# Patient Record
Sex: Female | Born: 1935 | Race: White | Hispanic: No | Smoking: Never smoker
Health system: Southern US, Community
[De-identification: ages and names within clinical notes are randomized; demographics above are authoritative.]

## PROBLEM LIST (undated history)

## (undated) DIAGNOSIS — I10 Essential (primary) hypertension: Secondary | ICD-10-CM

## (undated) DIAGNOSIS — E785 Hyperlipidemia, unspecified: Secondary | ICD-10-CM

## (undated) DIAGNOSIS — I998 Other disorder of circulatory system: Secondary | ICD-10-CM

## (undated) DIAGNOSIS — E119 Type 2 diabetes mellitus without complications: Secondary | ICD-10-CM

## (undated) DIAGNOSIS — I739 Peripheral vascular disease, unspecified: Secondary | ICD-10-CM

## (undated) DIAGNOSIS — Z9289 Personal history of other medical treatment: Secondary | ICD-10-CM

## (undated) DIAGNOSIS — M1990 Unspecified osteoarthritis, unspecified site: Secondary | ICD-10-CM

## (undated) DIAGNOSIS — R609 Edema, unspecified: Secondary | ICD-10-CM

## (undated) DIAGNOSIS — Z9862 Peripheral vascular angioplasty status: Secondary | ICD-10-CM

## (undated) DIAGNOSIS — R011 Cardiac murmur, unspecified: Secondary | ICD-10-CM

## (undated) DIAGNOSIS — R7989 Other specified abnormal findings of blood chemistry: Secondary | ICD-10-CM

## (undated) DIAGNOSIS — R6889 Other general symptoms and signs: Secondary | ICD-10-CM

## (undated) DIAGNOSIS — I7779 Dissection of other artery: Secondary | ICD-10-CM

## (undated) HISTORY — DX: Cardiac murmur, unspecified: R01.1

## (undated) HISTORY — PX: JOINT REPLACEMENT: SHX530

## (undated) HISTORY — DX: Edema, unspecified: R60.9

---

## 1999-10-22 ENCOUNTER — Ambulatory Visit: Admission: RE | Admit: 1999-10-22 | Discharge: 1999-10-22 | Payer: Self-pay | Admitting: Specialist

## 1999-10-22 ENCOUNTER — Encounter: Payer: Self-pay | Admitting: Specialist

## 1999-11-04 ENCOUNTER — Encounter (INDEPENDENT_AMBULATORY_CARE_PROVIDER_SITE_OTHER): Payer: Self-pay | Admitting: Specialist

## 1999-11-04 ENCOUNTER — Ambulatory Visit (HOSPITAL_COMMUNITY): Admission: RE | Admit: 1999-11-04 | Discharge: 1999-11-04 | Payer: Self-pay | Admitting: Specialist

## 2000-12-19 ENCOUNTER — Ambulatory Visit (HOSPITAL_COMMUNITY): Admission: RE | Admit: 2000-12-19 | Discharge: 2000-12-19 | Payer: Self-pay | Admitting: Specialist

## 2007-11-05 DIAGNOSIS — I10 Essential (primary) hypertension: Secondary | ICD-10-CM

## 2007-11-05 HISTORY — DX: Essential (primary) hypertension: I10

## 2008-10-19 ENCOUNTER — Emergency Department (HOSPITAL_COMMUNITY): Admission: EM | Admit: 2008-10-19 | Discharge: 2008-10-19 | Payer: Self-pay | Admitting: Emergency Medicine

## 2010-02-11 ENCOUNTER — Emergency Department (HOSPITAL_COMMUNITY): Admission: EM | Admit: 2010-02-11 | Discharge: 2010-02-11 | Payer: Self-pay | Admitting: Emergency Medicine

## 2010-03-11 ENCOUNTER — Emergency Department (HOSPITAL_COMMUNITY): Admission: EM | Admit: 2010-03-11 | Discharge: 2010-03-11 | Payer: Self-pay | Admitting: Emergency Medicine

## 2010-04-03 ENCOUNTER — Ambulatory Visit: Payer: Self-pay | Admitting: Vascular Surgery

## 2010-09-27 ENCOUNTER — Encounter: Payer: Self-pay | Admitting: Cardiovascular Disease

## 2010-09-27 ENCOUNTER — Encounter: Payer: Self-pay | Admitting: Family Medicine

## 2010-12-22 LAB — URINALYSIS, ROUTINE W REFLEX MICROSCOPIC
Bilirubin Urine: NEGATIVE
Glucose, UA: 100 mg/dL — AB
Hgb urine dipstick: NEGATIVE
Specific Gravity, Urine: 1.012 (ref 1.005–1.030)
Urobilinogen, UA: 0.2 mg/dL (ref 0.0–1.0)
pH: 5.5 (ref 5.0–8.0)

## 2010-12-22 LAB — URINE MICROSCOPIC-ADD ON

## 2011-01-22 NOTE — Op Note (Signed)
[place]. [facility]  Patient:    Terri Alvarez, Terri Alvarez Visit Number: [id number] MRN: [id number]          Service Type: DSU Location: [facility] 2858 01 Attending Physician:  Mick Sell Proc. Date: 11/04/99 Admit Date:  11/04/1999 Discharge Date: 11/04/1999                             Operative Report  SURGEON:  Chucky May, M.D.  PREOPERATIVE DIAGNOSIS:  Cataract, OD.  POSTOPERATIVE DIAGNOSIS:  Advanced glaucoma, OD with poor control on topical ocular medications.  PROCEDURE:  Cataract extraction with intraocular lens implant, OS.  INDICATIONS:  The patient is a 77 year old female with painless, progressive decrease in vision so that she has difficulty seeing for reading and driving. On examination she was found to have a dense nuclear sclerotic and cortical cataract consistent with decreasing visual acuity.  Additionally she has longstanding glaucoma with difficulty in controlling the pressure partially because of compliance difficulty.  Additionally, she has marked cupping with visual field loss.  Because of the difficulty with compliance and controlling her medications, it was felt that cataract extraction combined with filtration procedure would give her the best possibility for visual rehabilitation.  DESCRIPTION OF PROCEDURE:  The patient was brought to the main operating room and placed in the supine position.  Anesthesia was obtained by means of topical 4% lidocaine drops with Tetracaine.  She was then prepped and draped in the usual fashion.  The lid speculum was inserted and a limbal peritomy was performed superiorly with light cautery to control the bleeding.  A triangular 4 x 4 x 4 m flap was then developed from approximately 4 mm superior to the limbus at 12 oclock and dissected partial thickness down into the clear cornea with the diamond-blade knife. The cornea was entered with the diamond keratome at the base of this flap with  an additional port was created superior temporally in clear cornea with a 15 degree blade.  Occucoat was instilled and an anterior capsulorrhexis was performed without difficulty.  The nucleus was mobilized by hydrodissection following by phacoemulsification of the nucleus and removal of residual cortical material by irrigation and aspiration.  The posterior capsule was polished and posterior chamber lens implant was placed on the body without difficulty.  Miochol was then instilled and attention was turned to the filtration portion of the procedure where a 1 x 1 x 4 mm block on the posterior limb sclera was excised by sharp dissection.  There was spontaneous prolapse of the iris and Miochol was instilled to constrict the pupil followed by basilar peripheral iridectomy at the base of the flap.  The corneoscleral flap was then closed with two interrupted 10-0 nylon sutures followed by closure of the conjunctival flap  over this flap in a watertight fashion using two wing sutures of 10-0 nylon. After closure of the procedure the anterior chamber was deep and clear.  The intraocular lens was well centered.  There was early bleb formation in the eye felt normotensive. The eye was dressed with topical Pred-Forte, Occuflox, Voltaren, nd a Fox shield.  The patient was taken to the recovery room in excellent condition where she received written and verbal instructions for his postoperative care and was scheduled for follow-up in 24 hours.  Additionally, she was given 10 mg of Decadron intravenously. Attending Physician:  Mick Sell DD:  11/04/99 TD:  11/04/99 Job: [id number]

## 2011-05-17 IMAGING — CR DG ABDOMEN ACUTE W/ 1V CHEST
3 series · 3 of 3 positions shown · non-contrast
Comparison: None.

CLINICAL DATA: Abdominal distention.  Constipation.

ACUTE ABDOMEN SERIES (ABDOMEN 2 VIEW & CHEST 1 VIEW)

[w chest pa]
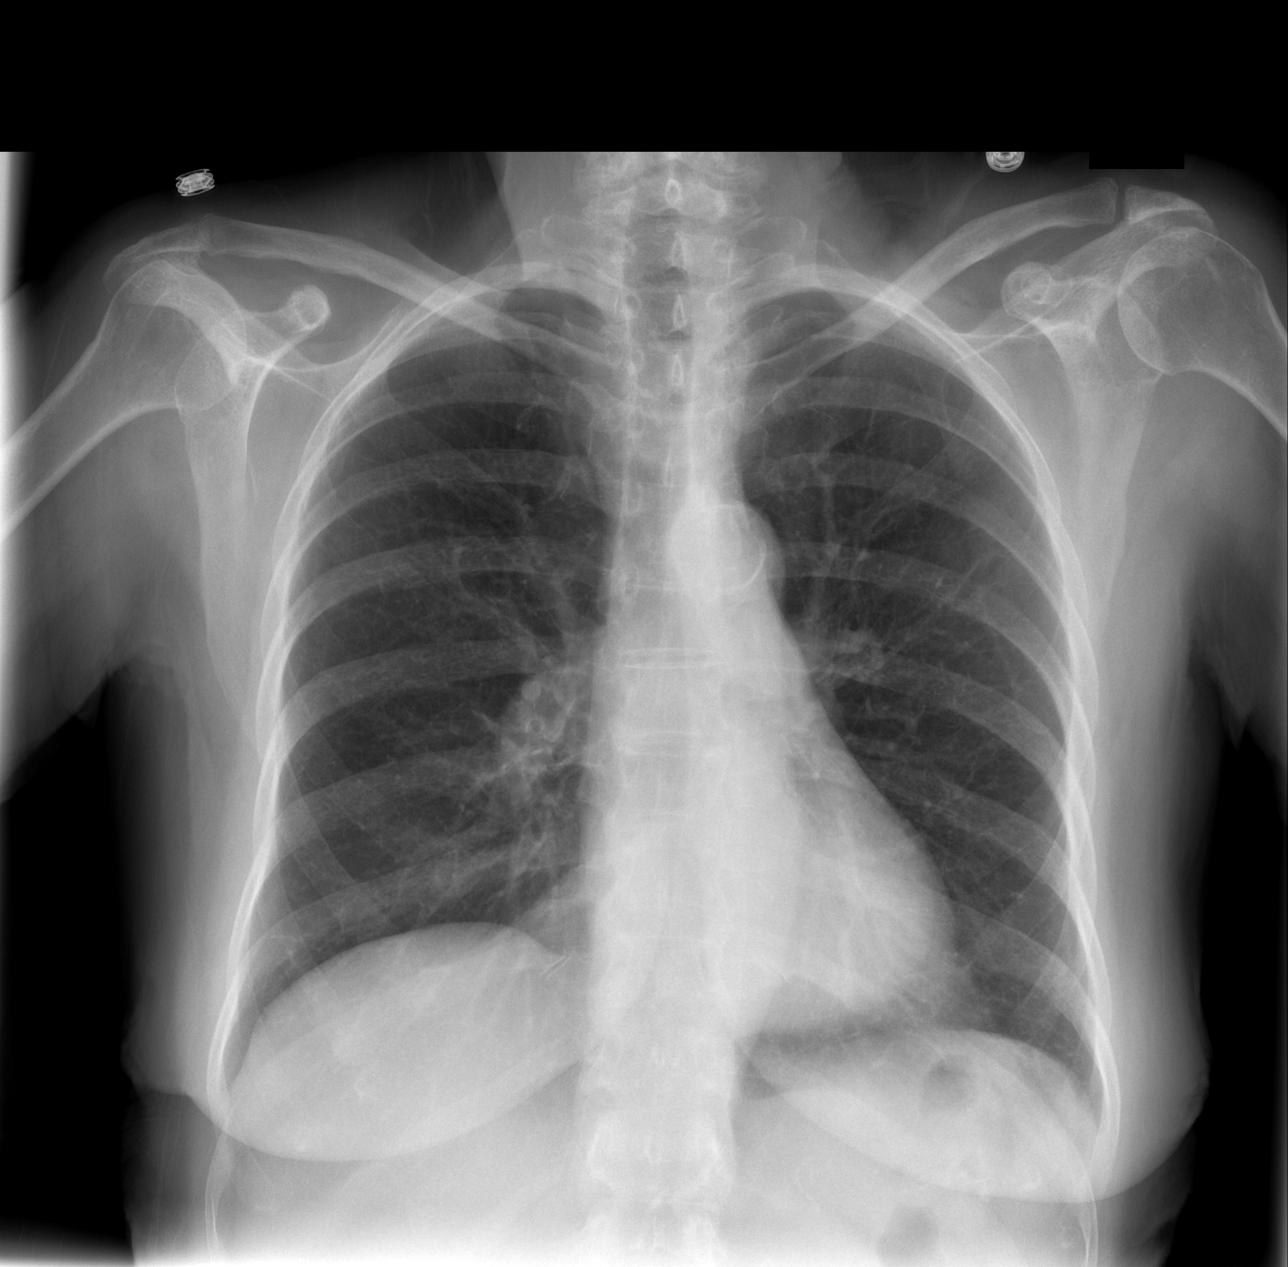

[w abdomen upright]
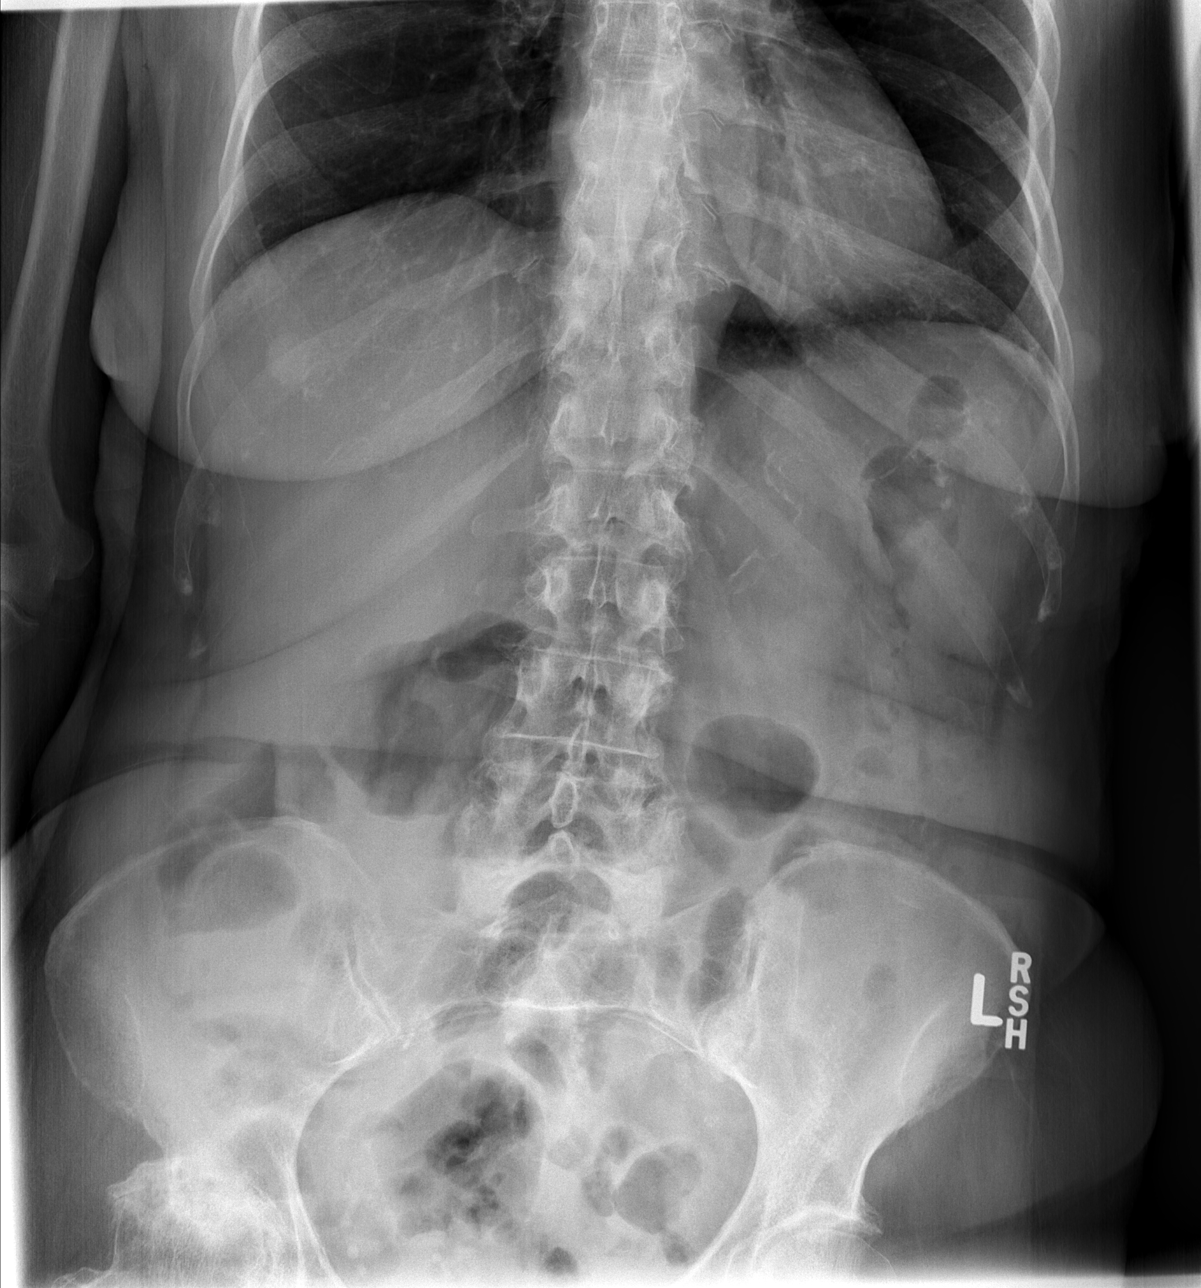

[t abdomen supine]
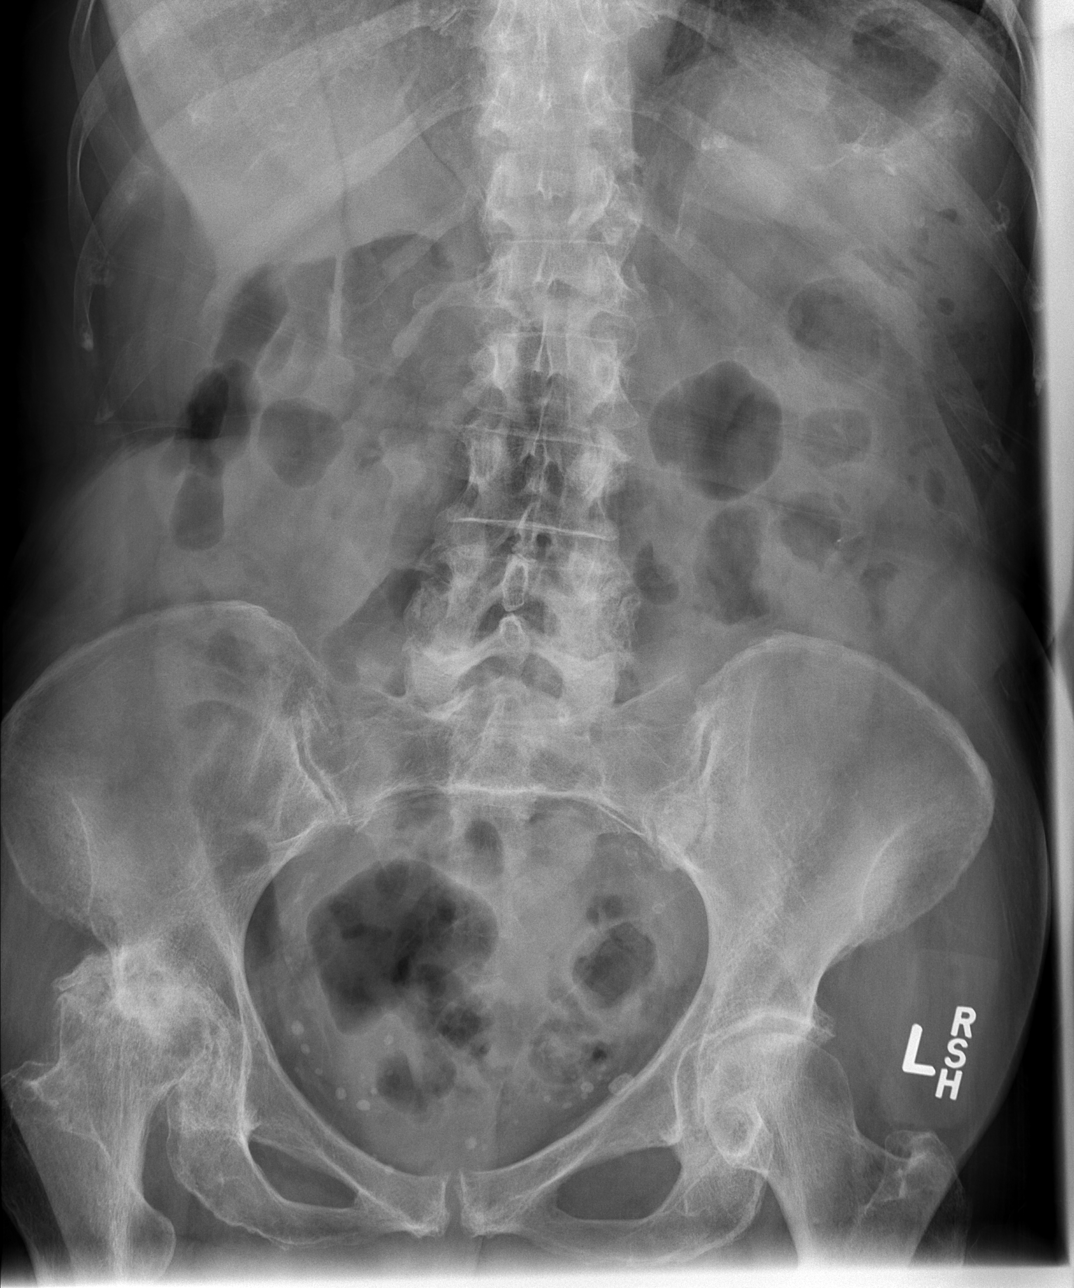

[3 of 3 positions shown; findings below may reference images not displayed]

FINDINGS: Heart size is normal and the vascularity is normal.  The
lungs are clear.

Normal bowel gas pattern.  Negative for bowel obstruction.  No
renal calculi.

Advanced degenerative change in the right hip joint with probable
AVN of the right femoral head.
IMPRESSION: No acute abnormality.

## 2011-09-07 HISTORY — PX: TOTAL HIP ARTHROPLASTY: SHX124

## 2011-12-06 DIAGNOSIS — Z9289 Personal history of other medical treatment: Secondary | ICD-10-CM

## 2011-12-06 HISTORY — DX: Personal history of other medical treatment: Z92.89

## 2011-12-15 ENCOUNTER — Other Ambulatory Visit: Payer: Self-pay | Admitting: Vascular Surgery

## 2011-12-27 ENCOUNTER — Other Ambulatory Visit: Payer: Self-pay | Admitting: Cardiovascular Disease

## 2011-12-29 ENCOUNTER — Ambulatory Visit
Admission: RE | Admit: 2011-12-29 | Discharge: 2011-12-29 | Disposition: A | Discharge status: Home / Self Care | Payer: Medicare Other | Source: Ambulatory Visit | Attending: Cardiovascular Disease | Admitting: Cardiovascular Disease

## 2011-12-29 ENCOUNTER — Other Ambulatory Visit: Payer: Self-pay | Admitting: Cardiovascular Disease

## 2011-12-29 DIAGNOSIS — Z01811 Encounter for preprocedural respiratory examination: Secondary | ICD-10-CM

## 2012-01-03 ENCOUNTER — Ambulatory Visit (HOSPITAL_COMMUNITY): Payer: Medicare Other | Admitting: Critical Care Medicine

## 2012-01-03 ENCOUNTER — Encounter (HOSPITAL_COMMUNITY)
Admission: RE | Disposition: A | Discharge status: Skilled Nursing Facility | Payer: Self-pay | Source: Ambulatory Visit | Attending: Vascular Surgery

## 2012-01-03 ENCOUNTER — Ambulatory Visit (HOSPITAL_COMMUNITY): Payer: Medicare Other

## 2012-01-03 ENCOUNTER — Inpatient Hospital Stay (HOSPITAL_COMMUNITY)
Admission: RE | Admit: 2012-01-03 | Discharge: 2012-01-07 | DRG: 252 | Disposition: A | Discharge status: Skilled Nursing Facility | Payer: Medicare Other | Source: Ambulatory Visit | Attending: Vascular Surgery | Admitting: Vascular Surgery

## 2012-01-03 ENCOUNTER — Encounter (HOSPITAL_COMMUNITY): Payer: Self-pay | Admitting: Critical Care Medicine

## 2012-01-03 ENCOUNTER — Encounter (HOSPITAL_COMMUNITY): Payer: Self-pay | Admitting: Infectious Diseases

## 2012-01-03 DIAGNOSIS — E785 Hyperlipidemia, unspecified: Secondary | ICD-10-CM | POA: Diagnosis present

## 2012-01-03 DIAGNOSIS — I10 Essential (primary) hypertension: Secondary | ICD-10-CM | POA: Diagnosis present

## 2012-01-03 DIAGNOSIS — Z9862 Peripheral vascular angioplasty status: Secondary | ICD-10-CM

## 2012-01-03 DIAGNOSIS — I70299 Other atherosclerosis of native arteries of extremities, unspecified extremity: Principal | ICD-10-CM | POA: Diagnosis present

## 2012-01-03 DIAGNOSIS — I998 Other disorder of circulatory system: Secondary | ICD-10-CM

## 2012-01-03 DIAGNOSIS — R6889 Other general symptoms and signs: Secondary | ICD-10-CM | POA: Diagnosis present

## 2012-01-03 DIAGNOSIS — Z7902 Long term (current) use of antithrombotics/antiplatelets: Secondary | ICD-10-CM

## 2012-01-03 DIAGNOSIS — I743 Embolism and thrombosis of arteries of the lower extremities: Secondary | ICD-10-CM

## 2012-01-03 DIAGNOSIS — I7779 Dissection of other artery: Secondary | ICD-10-CM | POA: Diagnosis not present

## 2012-01-03 DIAGNOSIS — I7092 Chronic total occlusion of artery of the extremities: Secondary | ICD-10-CM | POA: Diagnosis present

## 2012-01-03 DIAGNOSIS — R7989 Other specified abnormal findings of blood chemistry: Secondary | ICD-10-CM | POA: Diagnosis present

## 2012-01-03 DIAGNOSIS — E876 Hypokalemia: Secondary | ICD-10-CM | POA: Diagnosis present

## 2012-01-03 DIAGNOSIS — E119 Type 2 diabetes mellitus without complications: Secondary | ICD-10-CM | POA: Diagnosis present

## 2012-01-03 DIAGNOSIS — Z79899 Other long term (current) drug therapy: Secondary | ICD-10-CM

## 2012-01-03 HISTORY — DX: Peripheral vascular angioplasty status: Z98.62

## 2012-01-03 HISTORY — DX: Essential (primary) hypertension: I10

## 2012-01-03 HISTORY — DX: Type 2 diabetes mellitus without complications: E11.9

## 2012-01-03 HISTORY — DX: Other specified abnormal findings of blood chemistry: R79.89

## 2012-01-03 HISTORY — DX: Dissection of other specified artery: I77.79

## 2012-01-03 HISTORY — DX: Peripheral vascular disease, unspecified: I73.9

## 2012-01-03 HISTORY — DX: Other disorder of circulatory system: I99.8

## 2012-01-03 HISTORY — PX: ATHERECTOMY: SHX5502

## 2012-01-03 HISTORY — PX: VASCULAR SURGERY: SHX849

## 2012-01-03 HISTORY — PX: EMBOLECTOMY: SHX44

## 2012-01-03 HISTORY — PX: FEMORAL ARTERY - TIBIAL ARTERY BYPASS GRAFT: SUR181

## 2012-01-03 HISTORY — PX: LOWER EXTREMITY ANGIOGRAM: SHX5508

## 2012-01-03 HISTORY — DX: Other general symptoms and signs: R68.89

## 2012-01-03 HISTORY — DX: Hyperlipidemia, unspecified: E78.5

## 2012-01-03 LAB — GLUCOSE, CAPILLARY: Glucose-Capillary: 131 mg/dL — ABNORMAL HIGH (ref 70–99)

## 2012-01-03 SURGERY — EMBOLECTOMY
Anesthesia: General | Site: Leg Lower | Laterality: Right | Wound class: Clean

## 2012-01-03 SURGERY — ATHERECTOMY
Anesthesia: LOCAL

## 2012-01-03 MED ORDER — LIDOCAINE HCL (PF) 1 % IJ SOLN
INTRAMUSCULAR | Status: AC
  Filled 2012-01-03: qty 30

## 2012-01-03 MED ORDER — SODIUM CHLORIDE 0.9 % IV SOLN
INTRAVENOUS | Status: DC | PRN
  Administered 2012-01-03: 15:00:00 via INTRAVENOUS

## 2012-01-03 MED ORDER — POTASSIUM CHLORIDE CRYS ER 20 MEQ PO TBCR: 20.0000 meq | EXTENDED_RELEASE_TABLET | Freq: Every day | ORAL | Status: DC | PRN

## 2012-01-03 MED ORDER — DEXTROSE-NACL 5-0.45 % IV SOLN
INTRAVENOUS | Status: DC
  Administered 2012-01-03: 23:00:00 via INTRAVENOUS

## 2012-01-03 MED ORDER — PHENOL 1.4 % MT LIQD: 1.0000 | OROMUCOSAL | Status: DC | PRN

## 2012-01-03 MED ORDER — 0.9 % SODIUM CHLORIDE (POUR BTL) OPTIME
TOPICAL | Status: DC | PRN
  Administered 2012-01-03: 1000 mL

## 2012-01-03 MED ORDER — GUAIFENESIN-DM 100-10 MG/5ML PO SYRP: 15.0000 mL | ORAL_SOLUTION | ORAL | Status: DC | PRN

## 2012-01-03 MED ORDER — PHENYLEPHRINE HCL 10 MG/ML IJ SOLN
10.0000 mg | INTRAVENOUS | Status: DC | PRN
  Administered 2012-01-03: 19:00:00 via INTRAVENOUS
  Administered 2012-01-03: 10 ug/min via INTRAVENOUS

## 2012-01-03 MED ORDER — ONDANSETRON HCL 4 MG/2ML IJ SOLN
INTRAMUSCULAR | Status: DC | PRN
  Administered 2012-01-03 (×2): 4 mg via INTRAVENOUS

## 2012-01-03 MED ORDER — MIDAZOLAM HCL 2 MG/2ML IJ SOLN: 1.0000 mg | INTRAMUSCULAR | Status: DC | PRN

## 2012-01-03 MED ORDER — SODIUM CHLORIDE 0.9 % IV SOLN
INTRAVENOUS | Status: DC
  Administered 2012-01-03: 12:00:00 via INTRAVENOUS

## 2012-01-03 MED ORDER — HYDROMORPHONE HCL PF 1 MG/ML IJ SOLN
0.5000 mg | INTRAMUSCULAR | Status: DC | PRN
  Administered 2012-01-04 (×2): 0.5 mg via INTRAVENOUS
  Filled 2012-01-03: qty 1

## 2012-01-03 MED ORDER — ONDANSETRON HCL 4 MG/2ML IJ SOLN: 4.0000 mg | Freq: Four times a day (QID) | INTRAMUSCULAR | Status: DC | PRN

## 2012-01-03 MED ORDER — FUROSEMIDE 20 MG PO TABS
20.0000 mg | ORAL_TABLET | Freq: Every day | ORAL | Status: DC
  Administered 2012-01-04 – 2012-01-07 (×4): 20 mg via ORAL
  Filled 2012-01-03 (×4): qty 1

## 2012-01-03 MED ORDER — ACETAMINOPHEN 325 MG PO TABS: 650.0000 mg | ORAL_TABLET | ORAL | Status: DC | PRN

## 2012-01-03 MED ORDER — PROPOFOL 10 MG/ML IV EMUL
INTRAVENOUS | Status: DC | PRN
  Administered 2012-01-03: 50 mg via INTRAVENOUS

## 2012-01-03 MED ORDER — LORAZEPAM 2 MG/ML IJ SOLN: 1.0000 mg | Freq: Once | INTRAMUSCULAR | Status: DC | PRN

## 2012-01-03 MED ORDER — LISINOPRIL 20 MG PO TABS
20.0000 mg | ORAL_TABLET | Freq: Every day | ORAL | Status: DC
  Administered 2012-01-04 – 2012-01-07 (×4): 20 mg via ORAL
  Filled 2012-01-03 (×4): qty 1

## 2012-01-03 MED ORDER — HYDRALAZINE HCL 20 MG/ML IJ SOLN
10.0000 mg | INTRAMUSCULAR | Status: DC | PRN
  Filled 2012-01-03: qty 0.5

## 2012-01-03 MED ORDER — DIAZEPAM 5 MG PO TABS
5.0000 mg | ORAL_TABLET | ORAL | Status: AC
  Administered 2012-01-03: 5 mg via ORAL
  Filled 2012-01-03: qty 1

## 2012-01-03 MED ORDER — LABETALOL HCL 5 MG/ML IV SOLN
10.0000 mg | INTRAVENOUS | Status: DC | PRN
  Filled 2012-01-03: qty 4

## 2012-01-03 MED ORDER — SODIUM CHLORIDE 0.9 % IV SOLN: 500.0000 mL | Freq: Once | INTRAVENOUS | Status: AC | PRN

## 2012-01-03 MED ORDER — SODIUM CHLORIDE 0.9 % IJ SOLN: 3.0000 mL | INTRAMUSCULAR | Status: DC | PRN

## 2012-01-03 MED ORDER — DEXTROSE 5 % IV SOLN
1.5000 g | Freq: Two times a day (BID) | INTRAVENOUS | Status: DC
  Filled 2012-01-03: qty 1.5

## 2012-01-03 MED ORDER — ACETAMINOPHEN 650 MG RE SUPP: 325.0000 mg | RECTAL | Status: DC | PRN

## 2012-01-03 MED ORDER — MIDAZOLAM HCL 2 MG/2ML IJ SOLN
INTRAMUSCULAR | Status: AC
  Filled 2012-01-03: qty 2

## 2012-01-03 MED ORDER — TRAMADOL HCL 50 MG PO TABS
50.0000 mg | ORAL_TABLET | Freq: Four times a day (QID) | ORAL | Status: DC | PRN
  Administered 2012-01-03: 50 mg via ORAL
  Filled 2012-01-03: qty 1

## 2012-01-03 MED ORDER — LIDOCAINE HCL (CARDIAC) 20 MG/ML IV SOLN
INTRAVENOUS | Status: DC | PRN
  Administered 2012-01-03: 100 mg via INTRAVENOUS

## 2012-01-03 MED ORDER — EPHEDRINE SULFATE 50 MG/ML IJ SOLN
INTRAMUSCULAR | Status: DC | PRN
  Administered 2012-01-03: 5 mg via INTRAVENOUS

## 2012-01-03 MED ORDER — HEPARIN (PORCINE) IN NACL 2-0.9 UNIT/ML-% IJ SOLN
INTRAMUSCULAR | Status: AC
  Filled 2012-01-03: qty 1000

## 2012-01-03 MED ORDER — HEPARIN SODIUM (PORCINE) 1000 UNIT/ML IJ SOLN
INTRAMUSCULAR | Status: AC
  Filled 2012-01-03: qty 1

## 2012-01-03 MED ORDER — HYDROMORPHONE HCL PF 1 MG/ML IJ SOLN: 0.2500 mg | INTRAMUSCULAR | Status: DC | PRN

## 2012-01-03 MED ORDER — THROMBIN 20000 UNITS EX SOLR
CUTANEOUS | Status: DC | PRN
  Administered 2012-01-03: 20000 [IU] via TOPICAL

## 2012-01-03 MED ORDER — HETASTARCH-ELECTROLYTES 6 % IV SOLN
INTRAVENOUS | Status: DC | PRN
  Administered 2012-01-03: 18:00:00 via INTRAVENOUS

## 2012-01-03 MED ORDER — SODIUM CHLORIDE 0.9 % IR SOLN
Status: DC | PRN
  Administered 2012-01-03: 1000 mL

## 2012-01-03 MED ORDER — ROCURONIUM BROMIDE 100 MG/10ML IV SOLN
INTRAVENOUS | Status: DC | PRN
  Administered 2012-01-03: 40 mg via INTRAVENOUS
  Administered 2012-01-03: 10 mg via INTRAVENOUS

## 2012-01-03 MED ORDER — HEPARIN SODIUM (PORCINE) 1000 UNIT/ML IJ SOLN
INTRAMUSCULAR | Status: DC | PRN
  Administered 2012-01-03: 4 mL via INTRAVENOUS
  Administered 2012-01-03: 2 mL via INTRAVENOUS

## 2012-01-03 MED ORDER — LACTATED RINGERS IV SOLN
INTRAVENOUS | Status: DC | PRN
  Administered 2012-01-03 (×3): via INTRAVENOUS

## 2012-01-03 MED ORDER — NEOSTIGMINE METHYLSULFATE 1 MG/ML IJ SOLN
INTRAMUSCULAR | Status: DC | PRN
  Administered 2012-01-03: 3 mg via INTRAVENOUS

## 2012-01-03 MED ORDER — SODIUM CHLORIDE 0.9 % IV SOLN: INTRAVENOUS | Status: AC

## 2012-01-03 MED ORDER — ACETAMINOPHEN 325 MG PO TABS: 325.0000 mg | ORAL_TABLET | ORAL | Status: DC | PRN

## 2012-01-03 MED ORDER — IOHEXOL 300 MG/ML  SOLN
INTRAMUSCULAR | Status: DC | PRN
  Administered 2012-01-03: 50 mL via INTRA_ARTERIAL

## 2012-01-03 MED ORDER — ONDANSETRON HCL 4 MG/2ML IJ SOLN: 4.0000 mg | Freq: Once | INTRAMUSCULAR | Status: DC | PRN

## 2012-01-03 MED ORDER — BISACODYL 10 MG RE SUPP
10.0000 mg | Freq: Every day | RECTAL | Status: DC | PRN
  Filled 2012-01-03: qty 1

## 2012-01-03 MED ORDER — FENTANYL CITRATE 0.05 MG/ML IJ SOLN
INTRAMUSCULAR | Status: AC
  Filled 2012-01-03: qty 2

## 2012-01-03 MED ORDER — VECURONIUM BROMIDE 10 MG IV SOLR
INTRAVENOUS | Status: DC | PRN
  Administered 2012-01-03 (×2): 2 mg via INTRAVENOUS

## 2012-01-03 MED ORDER — PROTAMINE SULFATE 10 MG/ML IV SOLN
INTRAVENOUS | Status: DC | PRN
  Administered 2012-01-03 (×5): 10 mg via INTRAVENOUS

## 2012-01-03 MED ORDER — DOCUSATE SODIUM 100 MG PO CAPS
100.0000 mg | ORAL_CAPSULE | Freq: Every day | ORAL | Status: DC
  Administered 2012-01-04 – 2012-01-07 (×4): 100 mg via ORAL
  Filled 2012-01-03 (×4): qty 1

## 2012-01-03 MED ORDER — DEXTROSE 5 % IV SOLN
INTRAVENOUS | Status: AC
  Filled 2012-01-03: qty 50

## 2012-01-03 MED ORDER — CEPHALEXIN 500 MG PO CAPS
500.0000 mg | ORAL_CAPSULE | Freq: Two times a day (BID) | ORAL | Status: DC
  Administered 2012-01-05 – 2012-01-07 (×5): 500 mg via ORAL
  Filled 2012-01-03 (×6): qty 1

## 2012-01-03 MED ORDER — PHENYLEPHRINE HCL 10 MG/ML IJ SOLN
INTRAMUSCULAR | Status: DC | PRN
  Administered 2012-01-03 (×2): 80 ug via INTRAVENOUS
  Administered 2012-01-03 (×2): 40 ug via INTRAVENOUS

## 2012-01-03 MED ORDER — OXYCODONE-ACETAMINOPHEN 5-325 MG PO TABS
1.0000 | ORAL_TABLET | ORAL | Status: DC | PRN
  Administered 2012-01-04 (×2): 1 via ORAL
  Administered 2012-01-04: 2 via ORAL
  Administered 2012-01-04 (×2): 1 via ORAL
  Administered 2012-01-05: 2 via ORAL
  Administered 2012-01-05 (×2): 1 via ORAL
  Administered 2012-01-06 (×2): 2 via ORAL
  Administered 2012-01-07: 1 via ORAL
  Administered 2012-01-07: 2 via ORAL
  Filled 2012-01-03 (×2): qty 1
  Filled 2012-01-03: qty 2
  Filled 2012-01-03 (×4): qty 1
  Filled 2012-01-03 (×3): qty 2
  Filled 2012-01-03: qty 1
  Filled 2012-01-03: qty 2

## 2012-01-03 MED ORDER — DEXTROSE 5 % IV SOLN
1.5000 g | INTRAVENOUS | Status: DC | PRN
  Administered 2012-01-03: 1.5 g via INTRAVENOUS

## 2012-01-03 MED ORDER — GLYCOPYRROLATE 0.2 MG/ML IJ SOLN
INTRAMUSCULAR | Status: DC | PRN
  Administered 2012-01-03: 0.4 mg via INTRAVENOUS

## 2012-01-03 MED ORDER — ASPIRIN EC 81 MG PO TBEC
81.0000 mg | DELAYED_RELEASE_TABLET | Freq: Every day | ORAL | Status: DC
  Filled 2012-01-03: qty 1

## 2012-01-03 MED ORDER — CEFUROXIME SODIUM 1.5 G IJ SOLR
INTRAMUSCULAR | Status: AC
  Filled 2012-01-03: qty 1.5

## 2012-01-03 MED ORDER — FENTANYL CITRATE 0.05 MG/ML IJ SOLN: 50.0000 ug | INTRAMUSCULAR | Status: DC | PRN

## 2012-01-03 MED ORDER — POLYETHYLENE GLYCOL 3350 17 G PO PACK
17.0000 g | PACK | Freq: Every day | ORAL | Status: DC | PRN
  Administered 2012-01-07: 17 g via ORAL
  Filled 2012-01-03: qty 1

## 2012-01-03 MED ORDER — SODIUM CHLORIDE 0.9 % IR SOLN
Status: DC | PRN
  Administered 2012-01-03: 17:00:00

## 2012-01-03 MED ORDER — METOPROLOL TARTRATE 1 MG/ML IV SOLN: 2.0000 mg | INTRAVENOUS | Status: DC | PRN

## 2012-01-03 MED ORDER — CARVEDILOL 6.25 MG PO TABS
6.2500 mg | ORAL_TABLET | Freq: Every day | ORAL | Status: DC
  Administered 2012-01-04: 6.25 mg via ORAL
  Filled 2012-01-03 (×2): qty 1

## 2012-01-03 MED ORDER — FENTANYL CITRATE 0.05 MG/ML IJ SOLN
INTRAMUSCULAR | Status: DC | PRN
  Administered 2012-01-03 (×3): 50 ug via INTRAVENOUS
  Administered 2012-01-03 (×2): 25 ug via INTRAVENOUS
  Administered 2012-01-03: 50 ug via INTRAVENOUS

## 2012-01-03 MED ORDER — SODIUM CHLORIDE 0.9 % IV SOLN: 1.5000 g | INTRAVENOUS | Status: DC | PRN

## 2012-01-03 MED ORDER — DOPAMINE-DEXTROSE 3.2-5 MG/ML-% IV SOLN: 3.0000 ug/kg/min | INTRAVENOUS | Status: DC

## 2012-01-03 SURGICAL SUPPLY — 80 items
ADH SKN CLS LQ APL DERMABOND (GAUZE/BANDAGES/DRESSINGS) ×2
BANDAGE ELASTIC 4 VELCRO ST LF (GAUZE/BANDAGES/DRESSINGS) IMPLANT
BANDAGE ESMARK 6X9 LF (GAUZE/BANDAGES/DRESSINGS) IMPLANT
BNDG CMPR 9X6 STRL LF SNTH (GAUZE/BANDAGES/DRESSINGS)
BNDG ESMARK 6X9 LF (GAUZE/BANDAGES/DRESSINGS)
CANISTER SUCTION 2500CC (MISCELLANEOUS) ×3 IMPLANT
CATH EMB 3FR 40CM (CATHETERS) ×6 IMPLANT
CATH EMB 3FR 80CM (CATHETERS) ×4 IMPLANT
CATH EMB 4FR 80CM (CATHETERS) ×2 IMPLANT
CLIP TI MEDIUM 24 (CLIP) ×3 IMPLANT
CLIP TI WIDE RED SMALL 24 (CLIP) ×3 IMPLANT
CLOTH BEACON ORANGE TIMEOUT ST (SAFETY) ×3 IMPLANT
COVER PROBE W GEL 5X96 (DRAPES) ×5 IMPLANT
COVER SURGICAL LIGHT HANDLE (MISCELLANEOUS) ×6 IMPLANT
CUFF TOURNIQUET SINGLE 18IN (TOURNIQUET CUFF) IMPLANT
CUFF TOURNIQUET SINGLE 24IN (TOURNIQUET CUFF) IMPLANT
CUFF TOURNIQUET SINGLE 34IN LL (TOURNIQUET CUFF) IMPLANT
CUFF TOURNIQUET SINGLE 44IN (TOURNIQUET CUFF) IMPLANT
DERMABOND ADHESIVE PROPEN (GAUZE/BANDAGES/DRESSINGS) ×1
DERMABOND ADVANCED .7 DNX6 (GAUZE/BANDAGES/DRESSINGS) ×1 IMPLANT
DRAIN CHANNEL 15F RND FF W/TCR (WOUND CARE) IMPLANT
DRAPE C-ARM 42X72 X-RAY (DRAPES) ×3 IMPLANT
DRAPE U-SHAPE 47X51 STRL (DRAPES) ×2 IMPLANT
DRAPE WARM FLUID 44X44 (DRAPE) ×3 IMPLANT
DRSG COVADERM 4X8 (GAUZE/BANDAGES/DRESSINGS) IMPLANT
DRSG TEGADERM 4X4.75 (GAUZE/BANDAGES/DRESSINGS) ×2 IMPLANT
ELECT REM PT RETURN 9FT ADLT (ELECTROSURGICAL) ×3
ELECTRODE REM PT RTRN 9FT ADLT (ELECTROSURGICAL) ×2 IMPLANT
EVACUATOR SILICONE 100CC (DRAIN) IMPLANT
GAUZE SPONGE 4X4 16PLY XRAY LF (GAUZE/BANDAGES/DRESSINGS) ×2 IMPLANT
GLOVE BIO SURGEON STRL SZ 6.5 (GLOVE) ×6 IMPLANT
GLOVE BIO SURGEON STRL SZ7 (GLOVE) ×7 IMPLANT
GLOVE BIOGEL PI IND STRL 6.5 (GLOVE) ×4 IMPLANT
GLOVE BIOGEL PI IND STRL 7.0 (GLOVE) ×4 IMPLANT
GLOVE BIOGEL PI IND STRL 7.5 (GLOVE) ×3 IMPLANT
GLOVE BIOGEL PI INDICATOR 6.5 (GLOVE) ×4
GLOVE BIOGEL PI INDICATOR 7.0 (GLOVE) ×4
GLOVE BIOGEL PI INDICATOR 7.5 (GLOVE) ×2
GLOVE ECLIPSE 6.5 STRL STRAW (GLOVE) ×2 IMPLANT
GOWN PREVENTION PLUS XXLARGE (GOWN DISPOSABLE) ×2 IMPLANT
GOWN STRL NON-REIN LRG LVL3 (GOWN DISPOSABLE) ×13 IMPLANT
GOWN STRL REIN XL XLG (GOWN DISPOSABLE) ×4 IMPLANT
KIT BASIN OR (CUSTOM PROCEDURE TRAY) ×3 IMPLANT
KIT ROOM TURNOVER OR (KITS) ×3 IMPLANT
MARKER GRAFT CORONARY BYPASS (MISCELLANEOUS) IMPLANT
NS IRRIG 1000ML POUR BTL (IV SOLUTION) ×6 IMPLANT
PACK PERIPHERAL VASCULAR (CUSTOM PROCEDURE TRAY) ×3 IMPLANT
PAD ARMBOARD 7.5X6 YLW CONV (MISCELLANEOUS) ×6 IMPLANT
PADDING CAST COTTON 6X4 STRL (CAST SUPPLIES) IMPLANT
PATCH VASCULAR VASCU GUARD 1X6 (Vascular Products) ×2 IMPLANT
SET COLLECT BLD 21X3/4 12 PB (MISCELLANEOUS) ×2 IMPLANT
SHEATH AVANTI 11CM 5FR (MISCELLANEOUS) ×2 IMPLANT
SPONGE GAUZE 4X4 12PLY (GAUZE/BANDAGES/DRESSINGS) ×2 IMPLANT
SPONGE SURGIFOAM ABS GEL 100 (HEMOSTASIS) ×2 IMPLANT
STAPLER VISISTAT 35W (STAPLE) IMPLANT
STOPCOCK 4 WAY LG BORE MALE ST (IV SETS) ×5 IMPLANT
SUT ETHILON 4 0 P 3 18 (SUTURE) ×4 IMPLANT
SUT MNCRL AB 4-0 PS2 18 (SUTURE) ×6 IMPLANT
SUT PROLENE 5 0 C 1 24 (SUTURE) ×3 IMPLANT
SUT PROLENE 6 0 BV (SUTURE) ×7 IMPLANT
SUT PROLENE 7 0 BV 1 (SUTURE) ×2 IMPLANT
SUT PROLENE 7 0 BV1 MDA (SUTURE) ×2 IMPLANT
SUT SILK 2 0 FS (SUTURE) IMPLANT
SUT SILK 2 0 SH (SUTURE) ×3 IMPLANT
SUT SILK 3 0 (SUTURE)
SUT SILK 3-0 18XBRD TIE 12 (SUTURE) IMPLANT
SUT VIC AB 2-0 CT1 27 (SUTURE) ×6
SUT VIC AB 2-0 CT1 TAPERPNT 27 (SUTURE) ×4 IMPLANT
SUT VIC AB 3-0 SH 27 (SUTURE) ×6
SUT VIC AB 3-0 SH 27X BRD (SUTURE) ×4 IMPLANT
SYR 3ML LL SCALE MARK (SYRINGE) ×2 IMPLANT
SYR TB 1ML LUER SLIP (SYRINGE) ×2 IMPLANT
TAPE CLOTH SURG 4X10 WHT LF (GAUZE/BANDAGES/DRESSINGS) ×2 IMPLANT
TOWEL OR 17X24 6PK STRL BLUE (TOWEL DISPOSABLE) ×6 IMPLANT
TOWEL OR 17X26 10 PK STRL BLUE (TOWEL DISPOSABLE) ×6 IMPLANT
TRAY FOLEY CATH 14FRSI W/METER (CATHETERS) ×3 IMPLANT
TUBING EXTENTION W/L.L. (IV SETS) ×3 IMPLANT
UNDERPAD 30X30 INCONTINENT (UNDERPADS AND DIAPERS) ×3 IMPLANT
WATER STERILE IRR 1000ML POUR (IV SOLUTION) ×3 IMPLANT
WIRE MICRO SET SILHO 5FR 7 (SHEATH) ×3 IMPLANT

## 2012-01-03 NOTE — Transfer of Care (Signed)
Immediate Anesthesia Transfer of Care Note  Patient: Terri Alvarez  Procedure(s) Performed: Procedure(s) (LRB): BYPASS GRAFT FEMORAL-TIBIAL ARTERY (Right)  Patient Location: PACU  Anesthesia Type: General  Level of Consciousness: awake, alert  and oriented  Airway & Oxygen Therapy: Patient Spontanous Breathing and Patient connected to nasal cannula oxygen  Post-op Assessment: Report given to PACU RN, Post -op Vital signs reviewed and stable and Patient moving all extremities X 4  Post vital signs: Reviewed and stable  Complications: No apparent anesthesia complications

## 2012-01-03 NOTE — H&P (Signed)
H & P will be scanned in.  Pt was reexamined and existing H & P reviewed. No changes found.  Runell Gess, MD [facility] 01/03/2012 1:14 PM

## 2012-01-03 NOTE — Op Note (Signed)
Terri Alvarez is a 78 y.o. female    [id number] LOCATION:  FACILITY: [facility]  PHYSICIAN: Nanetta Batty, M.D. November 03, 1927   DATE OF PROCEDURE:  01/03/2012  DATE OF DISCHARGE:  [facility]  PV Angiogram/Intervention    History obtained from chart review. Ms. Clauson is an 78 year old frail-appearing female with critical limb ischemia and lotion he Dopplers which showed high-grade disease in the mid right SFA. She has a negative Myoview. She presents now for angiography and potential Percocet intervention. Positive hypertension, hypokalemia and diabetes.   PROCEDURE DESCRIPTION:    The patient was brought to the second floor  [facility] lab in the postabsorptive state. She was premedicated with Valium by mouth and IV Versed and fentanyl. Her left groin was prepped and shaved in usual sterile fashion. Xylocaine 1% was used  for local anesthesia. A 5 French sheath was inserted into the left common femoral  artery using standard Seldinger technique. A 5 [place] tennis racket catheter was used for abdominal aortography bifemoral runoff using bolus chase digital subtraction step table technique. Visipaque dye was just for the entirety of the case. Retrograde aortic pressure is monitored in the case.  HEMODYNAMICS:    AO SYSTOLIC/AO DIASTOLIC: 178/51    ANGIOGRAPHIC RESULTS:   1: Abdominal aortogram-renal artery is widely patent. The infrarenal abdominal aorta was free of significant disease.  2: Left lower extremity-mild diffuse disease of the left SFA with one vessel runoff via the anterior tibial  3: Right lower extremity-95% focal calcified proximal right SFA, 70% segmental distal right SFA in the adductor canal, 90% above-the-knee focal popliteal stenosis two-vessel runoff via the anterior tibial and peroneal. The posterior tibial artery was occluded. The tibial vessels weren't diffusely diseased.   IMPRESSION:Ms. Sandall has critical limb ischemia  and fairly focal calcified lesions in her proximal mid to distal right SFA with two-vessel runoff. Her vessels are 4-5 mm in diameter making stenting problematic. The strategy will be to perform diamondback orbital rotational arthrectomy to debulk and low-pressure balloo ninflations subsequently in order to improve inflow and promote healing of her foot.  Procedure description: Allow access was obtained with a SOS catheter, angled Glidewire and 0.35 cm Rosen wire. A 7 French crossover sheath was advanced into the right external iliac artery over the bifurcation. The SFA was traversed with a 0.14 mm Rigali wire through a quick cross endhole catheter. Following the Toradol you wire was withdrawn and an 014 mm Viper wire was then exchanged and placed in the below-the-knee popliteal artery. Orbital atherectomy was performed with a 2 mm spelled per up 120,000 RPMs in the proximal SFA distal SFA and above in the popliteal artery.the patient received 4500 units of heparin intravenously with an ACT of 243. A total of 210 cc of contrast semester to the patient. Angiography revealed dissection with what appeared to be intraluminal filling defect. There was compromise of tibial flow as well. A prolonged inflation was performed with a 4 mm x 100 overlong balloon in the distal SFA resulting in improved flow but still poor distal flow and obvious dissection. After discussion with Dr. Salome Spotted, vascular surgeon, it was decided to abort the case and to take the patient for femoropopliteal bypass grafting and potential Fogarty thrombectomy.  Final impression: Dynabac orbital or tissue laparotomy, PTA of calcified right SFA for "critical limb ischemia". Unfortunately, I believe her vessels were very fragile and the atherectomy caused dissection of the distal right SFA with distal embolization compromising flow. She  did have a negative Myoview stress test. She left the peripheral vascular lab in stable condition on the way to  the operating room. I discussed the results of the case and the plan with her daughter.  Runell Gess MD, [facility] 01/03/2012 3:08 PM

## 2012-01-03 NOTE — Anesthesia Postprocedure Evaluation (Signed)
Anesthesia Post Note  Patient: Terri Alvarez  Procedure(s) Performed: Procedure(s) (LRB): BYPASS GRAFT FEMORAL-TIBIAL ARTERY (Right)  Anesthesia type: general  Patient location: PACU  Post pain: Pain level controlled  Post assessment: Patient's Cardiovascular Status Stable  Last Vitals:  Filed Vitals:   01/03/12 2130  BP:   Pulse: 72  Temp:   Resp: 14    Post vital signs: Reviewed and stable  Level of consciousness: sedated  Complications: No apparent anesthesia complications

## 2012-01-03 NOTE — Preoperative (Signed)
Beta Blockers   Reason not to administer Beta Blockers:Not Applicable 

## 2012-01-03 NOTE — Anesthesia Preprocedure Evaluation (Addendum)
Anesthesia Evaluation  Patient identified by MRN, date of birth, ID band Patient awake    Airway Mallampati: III  Neck ROM: Full  Mouth opening: Limited Mouth Opening  Dental  (+) Teeth Intact and Dental Advisory Given   Pulmonary  breath sounds clear to auscultation  Pulmonary exam normal       Cardiovascular hypertension, Pt. on home beta blockers + Peripheral Vascular Disease Rhythm:Regular Rate:Normal     Neuro/Psych    GI/Hepatic   Endo/Other  Diabetes mellitus-, Oral Hypoglycemic Agents  Renal/GU      Musculoskeletal   Abdominal   Peds  Hematology   Anesthesia Other Findings   Reproductive/Obstetrics                         Anesthesia Physical Anesthesia Plan  ASA: III and Emergent  Anesthesia Plan: General   Post-op Pain Management:    Induction: Intravenous  Airway Management Planned: Oral ETT  Additional Equipment:   Intra-op Plan:   Post-operative Plan: Extubation in OR  Informed Consent: I have reviewed the patients History and Physical, chart, labs and discussed the procedure including the risks, benefits and alternatives for the proposed anesthesia with the patient or authorized representative who has indicated his/her understanding and acceptance.   Dental advisory given  Plan Discussed with: CRNA and Surgeon  Anesthesia Plan Comments: (Acute occlusion of R. Femoral artery following attempted angioplasty today Htn Type 2 DM  Plan GA  Kipp Brood, MD)     Anesthesia Quick Evaluation

## 2012-01-03 NOTE — Anesthesia Procedure Notes (Signed)
Procedure Name: Intubation Date/Time: 01/03/2012 4:15 PM Performed by: Elon Alas Pre-anesthesia Checklist: Patient identified, Timeout performed, Emergency Drugs available, Suction available and Patient being monitored Patient Re-evaluated:Patient Re-evaluated prior to inductionOxygen Delivery Method: Circle system utilized Preoxygenation: Pre-oxygenation with 100% oxygen Intubation Type: IV induction Ventilation: Mask ventilation without difficulty Laryngoscope Size: Mac and 3 Grade View: Grade II Tube type: Oral Tube size: 7.5 mm Number of attempts: 1 Airway Equipment and Method: Stylet Placement Confirmation: positive ETCO2,  ETT inserted through vocal cords under direct vision and breath sounds checked- equal and bilateral Secured at: 21 cm Tube secured with: Tape Dental Injury: Teeth and Oropharynx as per pre-operative assessment

## 2012-01-03 NOTE — H&P (Signed)
  [facility] [place]  Brief History and Physical  History of Present Illness  Terri Alvarez is a 78 y.o. female who presents with chief complaint: acute right leg ischemia.  The patient underwent R SFA and popliteal orbital atherectomy and angioplasty and developed likely thrombosis of her popliteal artery and tibial arteries based on review of her images.   PMH, PSH, SH, FmH, ROS are on Dr. Hazle Coca scanned H&P  No current facility-administered medications on file prior to encounter.   Current Outpatient Prescriptions on File Prior to Encounter  Medication Sig Dispense Refill  . Calcium Carbonate-Vitamin D (CALCIUM + D PO) Take 1 tablet by mouth daily.      . carvedilol (COREG) 6.25 MG tablet Take 6.25 mg by mouth daily.      . furosemide (LASIX) 20 MG tablet Take 20 mg by mouth daily.        No Known Allergies  Physical Examination  Filed Vitals:   01/03/12 1158 01/03/12 1317  Pulse:  91  Height: 5' (1.524 m)   Weight: 112 lb (50.803 kg)    Body mass index is 21.87 kg/(m^2).  General: A&O x 3, WDWN, cachectic  Pulmonary: Sym exp, good air movt, CTAB, no rales, rhonchi, & wheezing  Cardiac: RRR, Nl S1, S2, no Murmurs, rubs or gallops  Gastrointestinal: soft, NTND, -G/R, - HSM, - masses, - CVAT B  Musculoskeletal: M/S 5/5 throughout , Extremities without ischemic changes except Right foot which appears ischemic  Laboratory See iStat  Medical Decision Making  Terri Alvarez is a 78 y.o. female who presents with: acute right leg ischemia related to percutaneous intervention.   The patient will be going emergently to the operating room for a right leg thrombectomy, possible femoral to popliteal vs tibial bypass, and possible fasciotomies The risk, benefits, and alternative for bypass operations were discussed with the patient.  The patient is aware the risks include but are not limited to: bleeding, infection, myocardial infarction, stroke, limb loss,  nerve damage, need for additional procedures in the future, wound complications, and inability to complete the bypass.  The patient is aware of these risks and agreed to proceed.  The patient is aware of the risks and agrees to proceed.  Leonides Sake, MD [facility] [place] Office: [phone] Pager: [phone]  01/03/2012, 4:05 PM

## 2012-01-03 NOTE — Op Note (Addendum)
OPERATIVE NOTE   PROCEDURE: 1. Right popliteal, anterior tibial, and peroneal artery thrombectomy 2. Patch angioplasty of right below-the-knee popliteal artery 3. Right common femoral artery cannulation with ultrasound guidance  4. Right leg angiogram 5. Distal anterior tibial exposure and thrombectomy  PRE-OPERATIVE DIAGNOSIS: Acute right leg ischemia due to tibial embolization from peripheral intervention  POST-OPERATIVE DIAGNOSIS: same as above  SURGEON: Leonides Sake, MD  ASSISTANT(S): Della Goo, [facility]  ANESTHESIA: general  ESTIMATED BLOOD LOSS: 500 cc  FINDING(S): 1. Blood flow down to level of ankle via anterior tibial artery with collaterals to foot 2. Good blood flow in ankle level anterior tibial artery and good backbleeding from foot 3. Peroneal artery terminates proximal to ankle level  SPECIMEN(S):  intrapopliteal artery debris  INDICATIONS:   Terri Alvarez is a 78 y.o. female who presented earlier in the cath lab with critical limb ischemia in right leg.  Dr. Allyson Sabal performed an orbital atherectomy and angioplasty of right superficial femoral artery and popliteal artery.  Intra-procedure, loss of tibial blood flow was identified.  Possible embolism to the right tibial arteries was diagnosed.  As the patient had acute limb ischemia from the embolism, I felt emergent intervention was necessary.  We discussed performing a right leg thrombectomy, possible femoral to popliteal vs tibial bypass, and possible other procedures as indicated.  The risk, benefits, and alternatives were discussed with the patient.  The patient is aware the risks include but are not limited to: bleeding, infection, myocardial infarction, stroke, limb loss, nerve damage, need for additional procedures in the future, wound complications, and inability to complete the bypass.  The patient is aware of these risks and agreed to proceed.  DESCRIPTION: After obtaining full informed written consent, the  patient was brought back to the operating room and placed supine upon the operating table.  The patient received IV antibiotics prior to induction.  After obtaining adequate anesthesia, the patient was prepped and draped in the standard fashion for: right leg bypass.  I turned my attention to the calf.  I made a longitudinal incision one finger-width posterior to the tibia and dissected deep with electrocautery and blunt dissection.  I opened the deep posterior compartment fascia and retracted the medial gastrocnemius head posteriorly, gaining access to the popliteal space.  I dissected out the medial popliteal vein and retracted it out of the way.  I then dissected the popliteal artery off the lateral popliteal vein.  I dissected out the popliteal artery distal to the anterior tibial artery takeoff and the tibioperoneal trunk.  There was no pulse in the below-the-knee popliteal artery.  The artery was also extremely diseased and calcified.  I gave the patient 4000 units of Heparin intravenously which was a therapeutic bolus.  Later in the case, I gave another 2000 units of Heparin for a total of 6000 units.  After waiting 3 minutes, I clamped the popliteal artery proximally and distally and made an arteriotomy with difficulty due to the calcification.  I extended the arteriotomy proximally and distally with a Potts scissor.  After releasing the proximal clamp, a sudden release of blood also brought some intimal debris, likely the source of embolization in this patient.  I injected some heparinized saline into the artery and reclamped the artery.  I then open the distal clamp and there was very limited bleeding from the peroneal and anterior tibial arteries.  I passed the 3 Fogarty balloon were great difficulty distally and retrieved intima debris from both arteries.  I extended  the arteriotomy down onto the tibioperoneal trunk so I could fully visualize the orifice both vessels.  There was severe calcific plaque  throughout the popliteal artery and these tibial arteries.  With great difficulty, I did a partial endarterectomy of the below-the-knee popliteal artery and the orifices of the peroneal and anterior tibial arteries.  Essentially, I debrided loose intimal and easily extracted calcific plaques, without destroying the integrity of the arterial wall.  I injected heparinized saline into each tibial artery and clamped them.  I then pass the 3 Fogarty proximally and extracted an extensive piece of intima.  It is not clear if this was embolic material or if I had extracted the dissection flap that was present on the completion angiogram in the cath lab.  I made another pass with the Fogarty but it ruptured on calcified plaque.  Using a new 3 Fogarty, I made a few more passes, extracting some intimal debris but never any thrombus.  After a couple of clear passes, I felt a right leg angiogram was indicated.  I reinjected heparinized saline into the popliteal artery and clamped it.  I then obtained a bovine pericardial patch and shaped for the geometry of this popliteal arteriotomy extending onto the tibioperoneal trunk.  I sewed the patch to this segment of artery with two running stitches of 6-0 Prolene.  Prior to completing this patch angioplasty, I allowed the tibial arteries to backbleed and the popliteal artery to bleed.  There was good bleeding from each artery.  The patch angioplasty was completed in the usual fashion.  There no further bleeding from the patched artery.   I then turned my attention to the right groin.  Under ultrasound guidance, the right common femoral artery was cannulated with a micropuncture needle.  The microwire was advanced into the iliac arterial system.  The needle was exchanged for a microsheath, which was loaded into the common femoral artery over the wire.  The microwire was exchanged for a [facility] wire which was advanced into the aorta.  The microsheath was then exchanged for a 5-Fr  sheath which was loaded into the common femoral artery.  Then performed a right leg angiogram in station with hand injections.  Based on the findings, I felt the distal anterior tibial artery needed to be exposed to perform a distal thrombectomy to get blood flow onto the foot.  I did not intend on exposure of the distal peroneal artery as resection of the distal fibula is required.   I then turned my attention to the distal leg.  I made a longitudinal incision between the fibula and tibia and dissected deep, retracting tendons lateral until I had exposure of the deep peroneal nerve and the anterior tibial artery deep to it.  I made an arteriotomy transversely and passed the 3 Fogarty proximally and distally.  I obtained no thrombus or debris from either end.  There was good antegrade bleeding from the proximal end of the anterior tibial artery.  There was also good backbleeding from the foot, though the completion angiogram did not identify inline flow to the foot.  I injected heparinized saline proximally and distally.  I repaired the arteriotomy with interrupted stitches of 7-0 Prolene.  At this point, I easily identified a dorsalis pedis signal on the foot.  The toes appeared less ischemia but did not appear normal.  At this point, I did not think any more could be done.    Thrombin and gelfoam was then placed in all wounds.  The patient was given a total of 50 mg of Protamine to reverse anticoagulation.  All wounds were washed out and no active bleeding was identified.  The distal anterior tibial artery exposure was repaired with a layer of 3-0 Vicryl in the subcutaneous tissue and the skin was closed with a running stitch of 4-0 Nylon.  The calf incision was closed with a layer of 2-0 Vicryl, a layer of 3-0 Vicryl, and a running subcuticular stitch of 4-0 Vicryl.  The skin was reinforced with Dermabond.  A sterile bandage was placed over the distal anterior tibial artery exposure and affixed with tape.  I  pulled the right femoral artery sheath and held pressure for 15 minutes.  A sterile bandage was was applied to this puncture site and secured with tape.  The drapes were then broken.  I then aspirated out the left femoral sheath and pulled it out and held pressure for 15 minutes.  A sterile bandage was was applied to this puncture site and secured with tape.    COMPLICATIONS: none  CONDITION: stable  Leonides Sake, MD [facility] [place] Office: [phone] Pager: [phone]  01/03/2012, 8:44 PM

## 2012-01-04 ENCOUNTER — Encounter (HOSPITAL_COMMUNITY): Payer: Self-pay | Admitting: Cardiology

## 2012-01-04 DIAGNOSIS — I998 Other disorder of circulatory system: Secondary | ICD-10-CM | POA: Diagnosis present

## 2012-01-04 DIAGNOSIS — I70229 Atherosclerosis of native arteries of extremities with rest pain, unspecified extremity: Secondary | ICD-10-CM

## 2012-01-04 DIAGNOSIS — E119 Type 2 diabetes mellitus without complications: Secondary | ICD-10-CM | POA: Diagnosis present

## 2012-01-04 DIAGNOSIS — I10 Essential (primary) hypertension: Secondary | ICD-10-CM

## 2012-01-04 DIAGNOSIS — I7779 Dissection of other artery: Secondary | ICD-10-CM

## 2012-01-04 DIAGNOSIS — R6889 Other general symptoms and signs: Secondary | ICD-10-CM | POA: Diagnosis present

## 2012-01-04 DIAGNOSIS — R7989 Other specified abnormal findings of blood chemistry: Secondary | ICD-10-CM

## 2012-01-04 DIAGNOSIS — E785 Hyperlipidemia, unspecified: Secondary | ICD-10-CM

## 2012-01-04 DIAGNOSIS — Z9862 Peripheral vascular angioplasty status: Secondary | ICD-10-CM

## 2012-01-04 HISTORY — DX: Dissection of other specified artery: I77.79

## 2012-01-04 HISTORY — DX: Atherosclerosis of native arteries of extremities with rest pain, unspecified extremity: I70.229

## 2012-01-04 HISTORY — DX: Peripheral vascular angioplasty status: Z98.62

## 2012-01-04 HISTORY — DX: Essential (primary) hypertension: I10

## 2012-01-04 HISTORY — DX: Other specified abnormal findings of blood chemistry: R79.89

## 2012-01-04 HISTORY — DX: Other general symptoms and signs: R68.89

## 2012-01-04 HISTORY — DX: Hyperlipidemia, unspecified: E78.5

## 2012-01-04 LAB — TYPE AND SCREEN
ABO/RH(D): O NEG
Antibody Screen: NEGATIVE
Unit division: 0

## 2012-01-04 LAB — BASIC METABOLIC PANEL
Calcium: 7.7 mg/dL — ABNORMAL LOW (ref 8.4–10.5)
GFR calc Af Amer: 52 mL/min — ABNORMAL LOW (ref >90)
GFR calc non Af Amer: 45 mL/min — ABNORMAL LOW (ref >90)
Glucose, Bld: 157 mg/dL — ABNORMAL HIGH (ref 70–99)
Sodium: 139 mEq/L (ref 135–145)

## 2012-01-04 LAB — CBC
MCH: 31.5 pg (ref 26.0–34.0)
MCHC: 34.8 g/dL (ref 30.0–36.0)
Platelets: 103 10*3/uL — ABNORMAL LOW (ref 150–400)
RBC: 3.33 MIL/uL — ABNORMAL LOW (ref 3.87–5.11)

## 2012-01-04 LAB — GLUCOSE, CAPILLARY

## 2012-01-04 MED ORDER — INSULIN ASPART 100 UNIT/ML ~~LOC~~ SOLN: 0.0000 [IU] | SUBCUTANEOUS | Status: DC

## 2012-01-04 MED ORDER — CLOPIDOGREL BISULFATE 75 MG PO TABS
75.0000 mg | ORAL_TABLET | Freq: Every day | ORAL | Status: DC
  Administered 2012-01-05 – 2012-01-07 (×3): 75 mg via ORAL
  Filled 2012-01-04 (×4): qty 1

## 2012-01-04 MED ORDER — SODIUM CHLORIDE 0.9 % IV SOLN
INTRAVENOUS | Status: DC
  Administered 2012-01-04 (×2): via INTRAVENOUS

## 2012-01-04 MED ORDER — CLOPIDOGREL BISULFATE 300 MG PO TABS
300.0000 mg | ORAL_TABLET | Freq: Once | ORAL | Status: AC
  Administered 2012-01-04: 300 mg via ORAL
  Filled 2012-01-04: qty 1

## 2012-01-04 MED FILL — Nicardipine HCl IV Soln 2.5 MG/ML: INTRAVENOUS | Qty: 1 | Status: AC

## 2012-01-04 NOTE — Evaluation (Signed)
Physical Therapy Evaluation Patient Details Name: Terri Alvarez MRN: [id number] DOB: 12/18/1927 Today's Date: 01/04/2012 Time: 4098-1191 PT Time Calculation (min): 32 min  PT Assessment / Plan / Recommendation Clinical Impression  Pt s/p RLE bypass graft and thrombectomy who demonstrates decreased gait and mobility and does not have 24 hr care available. Pt will benefit from acute therapy to maximize transfers, gait and safety prior to discharge to next venue to decrease burden of care. Pt with increased HR up to 140 with minimal activity today but asymptomatic.    PT Assessment  Patient needs continued PT services    Follow Up Recommendations  Skilled nursing facility    Equipment Recommendations  Defer to next venue    Frequency Min 3X/week    Precautions / Restrictions Precautions Precautions: Fall Restrictions Weight Bearing Restrictions: No   Pertinent Vitals/Pain unrated surgical RLE pain end of session after weight bearing HR up to 140 with activity     Mobility  Bed Mobility Bed Mobility: Supine to Sit;Sitting - Scoot to Edge of Bed Supine to Sit: 3: Mod assist Sitting - Scoot to Edge of Bed: 3: Mod assist Details for Bed Mobility Assistance: pt attempting to pivot legs to EOB then elevate trunk but having difficulty with this sequence and when offered alternative method of rolling first pt resistant to change in her plan but required assist to elevate trunk and scoot hips with pad Transfers Transfers: Sit to Stand;Stand to Sit Sit to Stand: 4: Min assist;From bed Stand to Sit: 4: Min assist;To chair/3-in-1 Details for Transfer Assistance: cueing for hand placement and sequence with assist for anterior translation Ambulation/Gait Ambulation/Gait Assistance: 4: Min assist Ambulation Distance (Feet): 30 Feet Assistive device: Rolling walker Ambulation/Gait Assistance Details: cueing to step into RW, increase stride and step into RW with LLE. Pt maintaining step to  pattern with LLE externally rotated and posterior to RLE despite cueing and assist. Cueing to look up and extend trunk as well.  Gait Pattern: Step-to pattern;Trunk flexed;Decreased stance time - right;Decreased step length - left Stairs: No    Exercises     PT Goals Acute Rehab PT Goals PT Goal Formulation: With patient Time For Goal Achievement: 01/18/12 Potential to Achieve Goals: Fair Pt will go Supine/Side to Sit: with supervision PT Goal: Supine/Side to Sit - Progress: Goal set today Pt will go Sit to Supine/Side: with supervision PT Goal: Sit to Supine/Side - Progress: Goal set today Pt will go Sit to Stand: with supervision PT Goal: Sit to Stand - Progress: Goal set today Pt will go Stand to Sit: with supervision PT Goal: Stand to Sit - Progress: Goal set today Pt will Ambulate: 51 - 150 feet;with least restrictive assistive device;with supervision PT Goal: Ambulate - Progress: Goal set today  Visit Information  Last PT Received On: 01/04/12 Assistance Needed: +1 PT/OT Co-Evaluation/Treatment: Yes    Subjective Data  Subjective: I think I'll be doing ok in a couple days Patient Stated Goal: return home and be able to walk   Prior Functioning  Home Living Lives With: Alone Available Help at Discharge: Personal care attendant Type of Home: House Home Access: Ramped entrance Home Layout: Multi-level;1/2 bath on main level;Able to live on main level with bedroom/bathroom Bathroom Toilet: Standard Home Adaptive Equipment: Walker - rolling;Bedside commode/3-in-1;Straight cane;Wheelchair - manual Prior Function Level of Independence: Needs assistance Needs Assistance: Bathing;Dressing;Meal Prep;Light Housekeeping Bath: Moderate Dressing: Minimal Meal Prep: Maximal Light Housekeeping: Total Able to Take Stairs?: No Driving: No Vocation: Retired Special educational needs teacher  Communication: No difficulties    Cognition  Overall Cognitive Status: Appears within functional limits  for tasks assessed/performed Arousal/Alertness: Awake/alert Orientation Level: Appears intact for tasks assessed Behavior During Session: [facility] for tasks performed    Extremity/Trunk Assessment Right Lower Extremity Assessment RLE ROM/Strength/Tone: Deficits;Due to pain RLE ROM/Strength/Tone Deficits: decreased dorsiflexion, not formally tested secondary to s/p sx Left Lower Extremity Assessment LLE ROM/Strength/Tone: Deficits LLE ROM/Strength/Tone Deficits: gross assessment with transfers appeared 2+/5 but dragging LLE with gait   Balance    End of Session PT - End of Session Equipment Utilized During Treatment: Gait belt Activity Tolerance: Patient tolerated treatment well Patient left: in chair;with call bell/phone within reach Nurse Communication: Mobility status   Delorse Lek 01/04/2012, 9:24 AM  Delaney Meigs, PT [phone]

## 2012-01-04 NOTE — Evaluation (Signed)
Occupational Therapy Evaluation Patient Details Name: Terri Alvarez MRN: [id number] DOB: [birth year] Today's Date: 01/04/2012 Time:  -     OT Assessment / Plan / Recommendation Clinical Impression  Pt admitted for RLE bypass graft and thrombectomy who presents with below problem list and will benefit from skilled OT in the acute setting to maximize I with ADL and ADL mobility prior to d/c.     OT Assessment  Patient needs continued OT Services    Follow Up Recommendations  Skilled nursing facility    Equipment Recommendations  Defer to next venue    Frequency Min 1X/week    Precautions / Restrictions Precautions Precautions: Fall Restrictions Weight Bearing Restrictions: No   Pertinent Vitals/Pain C/o 3-4/10 groin pain. Pre-medicated    ADL  Eating/Feeding: Simulated;Set up;Supervision/safety Where Assessed - Eating/Feeding: Edge of bed Grooming: Performed;Teeth care;Supervision/safety;Set up Where Assessed - Grooming: Standing at sink Upper Body Bathing: Simulated;Minimal assistance Where Assessed - Upper Body Bathing: Sitting, chair Lower Body Bathing: Moderate assistance;Performed Where Assessed - Lower Body Bathing: Sit to stand from bed Upper Body Dressing: Performed;Minimal assistance Where Assessed - Upper Body Dressing: Sitting, bed Lower Body Dressing: Performed;+1 Total assistance (don socks) Where Assessed - Lower Body Dressing: Sitting, bed Toilet Transfer: Simulated;Minimal assistance Toilet Transfer Method: Ambulating (simulated EOB to chair) Toileting - Clothing Manipulation: Simulated;Minimal assistance Where Assessed - Toileting Clothing Manipulation: Standing Toileting - Hygiene: Simulated;Minimal assistance Where Assessed - Toileting Hygiene: Standing Tub/Shower Transfer: Not assessed Equipment Used: Rolling walker Ambulation Related to ADLs: Min A with RW ambulation. Max VC to bring left foot up with right as pt with tencency to let left foot drag.       OT Goals Acute Rehab OT Goals OT Goal Formulation: With patient Time For Goal Achievement: 01/18/12 Potential to Achieve Goals: Good ADL Goals Pt Will Perform Grooming: Independently;Standing at sink ADL Goal: Grooming - Progress: Goal set today Pt Will Perform Upper Body Dressing: Independently;Sitting, chair;Sitting, bed ADL Goal: Upper Body Dressing - Progress: Goal set today Pt Will Perform Lower Body Dressing: with min assist;Sit to stand from chair;Sit to stand from bed ADL Goal: Lower Body Dressing - Progress: Goal set today Pt Will Transfer to Toilet: with modified independence;Ambulation;with DME;3-in-1 ADL Goal: Toilet Transfer - Progress: Goal set today Pt Will Perform Toileting - Clothing Manipulation: with modified independence;Standing ADL Goal: Toileting - Clothing Manipulation - Progress: Goal set today Pt Will Perform Toileting - Hygiene: Independently;Sitting on 3-in-1 or toilet ADL Goal: Toileting - Hygiene - Progress: Goal set today  Visit Information  Assistance Needed: +1    Subjective Data  Patient Stated Goal: Return home   Prior Functioning  Home Living Lives With: Alone Available Help at Discharge: Personal care attendant Type of Home: House Home Access: Ramped entrance Home Layout: Multi-level;1/2 bath on main level;Able to live on main level with bedroom/bathroom Bathroom Toilet: Standard Home Adaptive Equipment: Walker - rolling;Bedside commode/3-in-1;Straight cane;Wheelchair - manual Prior Function Level of Independence: Needs assistance Needs Assistance: Bathing;Dressing;Meal Prep;Light Housekeeping Bath: Moderate Dressing: Minimal Meal Prep: Maximal Light Housekeeping: Total Able to Take Stairs?: No Driving: No Vocation: Retired Musician: No difficulties Dominant Hand: Right    Cognition  Overall Cognitive Status: Appears within functional limits for tasks assessed/performed Arousal/Alertness:  Awake/alert Orientation Level: Appears intact for tasks assessed Behavior During Session: [facility] for tasks performed    Extremity/Trunk Assessment Right Upper Extremity Assessment RUE ROM/Strength/Tone: WFL for tasks assessed RUE Sensation: WFL - Light Touch RUE Coordination: WFL - gross/fine motor Left  Upper Extremity Assessment LUE ROM/Strength/Tone: WFL for tasks assessed LUE Sensation: WFL - Light Touch LUE Coordination: WFL - gross/fine motor Right Lower Extremity Assessment RLE ROM/Strength/Tone: Deficits;Due to pain RLE ROM/Strength/Tone Deficits: decreased dorsiflexion, not formally tested secondary to s/p sx Left Lower Extremity Assessment LLE ROM/Strength/Tone: Deficits LLE ROM/Strength/Tone Deficits: gross assessment with transfers appeared 2+/5 but dragging LLE with gait   Mobility Bed Mobility Bed Mobility: Supine to Sit;Sitting - Scoot to Edge of Bed Supine to Sit: 3: Mod assist Sitting - Scoot to Edge of Bed: 3: Mod assist Details for Bed Mobility Assistance: pt attempting to pivot legs to EOB then elevate trunk but having difficulty with this sequence and when offered alternative method of rolling first pt resistant to change in her plan but required assist to elevate trunk and scoot hips with pad Transfers Sit to Stand: 4: Min assist;From bed Stand to Sit: 4: Min assist;To chair/3-in-1 Details for Transfer Assistance: cueing for hand placement and sequence with assist for anterior translation   Exercise    Balance    End of Session OT - End of Session Equipment Utilized During Treatment: Gait belt Activity Tolerance: Patient tolerated treatment well Patient left: in chair;with call bell/phone within reach;with nursing in room Nurse Communication: Mobility status   Damaris Abeln 01/04/2012, 9:29 AM

## 2012-01-04 NOTE — Progress Notes (Signed)
Dynabac orbital or tissue laparotomy, PTA of calcified right SFA for "critical limb ischemia". Unfortunately, I believe her vessels were very fragile and the atherectomy caused dissection of the distal right SFA with distal embolization compromising flow. She did have a negative Myoview stress test. She left the peripheral vascular lab in stable condition on the way to the operating room.   Pt. Underwent 01/03/12: Right popliteal, anterior tibial, and peroneal artery thrombectomy  2. Patch angioplasty of right below-the-knee popliteal artery  3. Right common femoral artery cannulation with ultrasound guidance  4. Right leg angiogram  5. Distal anterior tibial exposure and thrombectomy   POST OP DAY 1  Subjective:   Objective: Vital signs in last 24 hours: Temp:  [97 F (36.1 C)-99 F (37.2 C)] 97 F (36.1 C) (04/30 1025) Pulse Rate:  [65-129] 79  (04/30 1025) Resp:  [5-18] 18  (04/30 1025) BP: (90-166)/(28-89) 90/30 mmHg (04/30 1025) SpO2:  [93 %-100 %] 94 % (04/30 1025) FiO2 (%):  [2 %] 2 % (04/30 0300) Weight:  [50.8 kg (111 lb 15.9 oz)-50.803 kg (112 lb)] 50.8 kg (111 lb 15.9 oz) (04/29 2330) Weight change:    Intake/Output from previous day: 04/29 0701 - 04/30 0700 In: 4113.3 [P.O.:60; I.V.:2953.3; Blood:600; IV Piggyback:500] Out: 2225 [Urine:1675; Blood:550] Intake/Output this shift: Total I/O In: 150 [I.V.:150] Out: 135 [Urine:135]  PE: General:alert oriented Heart:S1S2 RRR Lungs:Clear. No rales, rhonchi Abd:+ BS, soft, non tender Ext:no to trace edema, weak pedal Rt. Neuro:alert and oriented.   Lab Results:  [facility] 01/04/12 0450  WBC 6.9  HGB 10.5*  HCT 30.2*  PLT 103*   BMET  Basename 01/04/12 0450  NA 139  K 4.2  CL 109  CO2 24  GLUCOSE 157*  BUN 22  CREATININE 1.12*  CALCIUM 7.7*   No results found for this basename: TROPONINI:2,CK,MB:2 in the last 72 hours  No results found for this basename: CHOL,  HDL,  LDLCALC,  LDLDIRECT,  TRIG,   CHOLHDL   No results found for this basename: HGBA1C     No results found for this basename: TSH    Hepatic Function Panel No results found for this basename: PROT,ALBUMIN,AST,ALT,ALKPHOS,BILITOT,BILIDIR,IBILI in the last 72 hours No results found for this basename: CHOL in the last 72 hours No results found for this basename: PROTIME in the last 72 hours    EKG:No orders found for this or any previous visit.  Studies/Results: Dg Tibia/fibula Right  01/04/2012  *RADIOLOGY REPORT*  Clinical Data: History of critical limb ischemia, status post peripheral intervention resulting in acute limb ischemia due to tibial embolization  C-ARM 1-60 MIN,RIGHT TIBIA AND FIBULA - 2 VIEW  Comparison: None available  Findings: Limited intraoperative subtraction right lower extremity angiogram.  This demonstrates patency of the right distal SFA with mild luminal irregularities related to plaque formation.  Popliteal artery across the knee is patent.  The anterior tibial and peroneal arteries are diseased but patent in the right lower extremity. Anterior tibial artery and peroneal artery both terminate above the ankle.  Small collaterals cross the ankle into the foot.  IMPRESSION: Patent right distal SFA and popliteal artery with two-vessel right lower extremity runoff via anterior tibial and peroneal arteries terminating above the foot at the ankle level.  Small collaterals cross the ankle into the foot.  Original Report Authenticated By: Judie Petit. Ruel Favors, M.D.   Dg C-arm 1-60 Min  01/04/2012  *RADIOLOGY REPORT*  Clinical Data: History of critical limb ischemia, status post peripheral intervention  resulting in acute limb ischemia due to tibial embolization  C-ARM 1-60 MIN,RIGHT TIBIA AND FIBULA - 2 VIEW  Comparison: None available  Findings: Limited intraoperative subtraction right lower extremity angiogram.  This demonstrates patency of the right distal SFA with mild luminal irregularities related to plaque  formation.  Popliteal artery across the knee is patent.  The anterior tibial and peroneal arteries are diseased but patent in the right lower extremity. Anterior tibial artery and peroneal artery both terminate above the ankle.  Small collaterals cross the ankle into the foot.  IMPRESSION: Patent right distal SFA and popliteal artery with two-vessel right lower extremity runoff via anterior tibial and peroneal arteries terminating above the foot at the ankle level.  Small collaterals cross the ankle into the foot.  Original Report Authenticated By: Judie Petit. Ruel Favors, M.D.    Medications: I have reviewed the patient's current medications.    . carvedilol  6.25 mg Oral Daily  . cephALEXin  500 mg Oral BID  . clopidogrel  300 mg Oral Once  . clopidogrel  75 mg Oral Q breakfast  . diazepam  5 mg Oral On Call  . docusate sodium  100 mg Oral Daily  . fentaNYL      . furosemide  20 mg Oral Daily  . heparin      . heparin      . lidocaine      . lisinopril  20 mg Oral Daily  . midazolam      . DISCONTD: aspirin EC  81 mg Oral Daily  . DISCONTD: cefUROXime (ZINACEF)  IV  1.5 g Intravenous Q12H  . DISCONTD: insulin aspart  0-9 Units Subcutaneous Q4H   Assessment/Plan: Patient Active Problem List  Diagnoses  . Critical lower limb ischemia, rt. foot.  . Abnormal ankle brachial index, rt. infrapopliteal disease, high grade Rt. SFA disease.  . Dissection of other artery, rt. SFA     . S/P angioplasty, patch of rt. below the knee popliteal artery , 01/03/12  and rt. pop. ant tib. and peroneal artery thrombectomy.  Marland Kitchen HTN (hypertension)  . Hyperlipemia  . DM (diabetes mellitus), type 2, non insulin depent.  . Abnormal TSH   PLAN:  Pt, doing well post op, transferred to tele bed from 2300 (ICU).   HR up to 140 with exertion.  Abnormal TSH- will follow as outpatient. Cr. Stable.  Daughter has concerns about her Mom at home.  Pt. Lives alone.  Will Have case manager eval.  PT and OT recommend SNF for  rehab.   LOS: 1 day   Alvarez,Terri R 01/04/2012, 11:11 AM    Agree with note written by Terri Alvarez RNP  S/P Surgical thrombectomy and patch angioplasyt of right popliteal artery by Dr. Imogene Alvarez after failed diamondback orbital rotational atherectomy. Both groins ok. Right foot warm and less erythematous. Hopefully home tomorrow. May need interim rehab facility or HHC. Case manager has been consulted.  Runell Gess 01/04/2012 11:40 AM

## 2012-01-04 NOTE — Progress Notes (Addendum)
[facility] [place]  Progress Note   Date of Surgery: 01/03/2012  Procedure(s): embolectomy fem-tibial arteries right leg with patch angioplasty right popliteal artery  Surgeon: Surgeon(s): Fransisco Hertz, MD  1 Day Post-Op  History of Present Illness  Terri Alvarez is a 78 y.o. female who had embolectomies of right fem-tibial arteries right leg. She is doing well this am and states her right leg feels good. The patient's pre-op symptoms of pain are Improved . Patients pain is well controlled.    Significant Diagnostic Studies: CBC Lab Results  Component Value Date   WBC 6.9 01/04/2012   HGB 10.5* 01/04/2012   HCT 30.2* 01/04/2012   MCV 90.7 01/04/2012   PLT 103* 01/04/2012    BMET     Component Value Date/Time   NA 139 01/04/2012 0450   K 4.2 01/04/2012 0450   CL 109 01/04/2012 0450   CO2 24 01/04/2012 0450   GLUCOSE 157* 01/04/2012 0450   BUN 22 01/04/2012 0450   CREATININE 1.12* 01/04/2012 0450   CALCIUM 7.7* 01/04/2012 0450   GFRNONAA 45* 01/04/2012 0450   GFRAA 52* 01/04/2012 0450    Physical Examination  BP Readings from Last 3 Encounters:  01/04/12 107/31  01/04/12 107/31  01/04/12 107/31   Temp Readings from Last 3 Encounters:  01/04/12 98.1 F (36.7 C) Oral  01/04/12 98.1 F (36.7 C) Oral  01/04/12 98.1 F (36.7 C) Oral   SpO2 Readings from Last 3 Encounters:  01/04/12 99%  01/04/12 99%  01/04/12 99%   Pulse Readings from Last 3 Encounters:  01/04/12 81  01/04/12 81  01/04/12 81    Pt is A&O x 3 right lower extremity: Incision/s is/are clean,dry.intact, and  healing without hematoma, erythema or drainage Limb is warm; with good color/ good sensation and motion right leg Both groins soft - no hematoma Right calf soft  Right Dorsalis Pedis pulse is monophasic by Doppler Right peroneal pulse is  monophasic by Doppler  Left Dorsalis Pedis pulse is monophasic by Doppler  Assessment/Plan: Pt. Doing well with good flow to right  foot Post-op pain is controlled Wounds are healing well PT/OT for ambulation DC foley TX 2000  Marlowe Shores [phone] 01/04/2012 7:53 AM  Addendum  I have independently interviewed and examined the patient, and I agree with the physician assistant's findings.  Pt has dopperable R DP, PT, and peroneal.  Both groins without hematoma.  Calf inc c/d/i, anterior compartment incision bandaged without active bleeding.  H/H acceptable.  Start Plavix today.  Tsfr to flr.  Possible D/C as early as tomorrow pending PT/OT eval for needs.  Leonides Sake, MD [facility] [place] Office: [phone] Pager: [phone]  01/04/2012, 8:19 AM

## 2012-01-04 NOTE — Clinical Documentation Improvement (Signed)
Anemia Blood Loss Clarification  THIS DOCUMENT IS NOT A PERMANENT PART OF THE MEDICAL RECORD  RESPOND TO THE THIS QUERY, FOLLOW THE INSTRUCTIONS BELOW:  1. If needed, update documentation for the patient's encounter via the notes activity.  2. Access this query again and click edit on the In [employer].  3. After updating, or not, click F2 to complete all highlighted (required) fields concerning your review. Select "additional documentation in the medical record" OR "no additional documentation provided".  4. Click Sign note button.  5. The deficiency will fall out of your In Basket *Please let us know if you are not able to complete this workflow by phone or e-mail (listed below).        01/04/12  Dear Dr.Ramiro Pangilinan Marton Redwood  In an effort to better capture your patient's severity of illness, reflect appropriate length of stay and utilization of resources, a review of the patient medical record has revealed the following indicators.    Based on your clinical judgment, please clarify and document in a progress note and/or discharge summary the clinical condition associated with the following supporting information:  In responding to this query please exercise your independent judgment.  The fact that a query is asked, does not imply that any particular answer is desired or expected.  Possible Clinical Conditions?   " Expected Acute Blood Loss Anemia  " Acute Blood Loss Anemia  " Acute on chronic blood loss anemia  " Chronic blood loss anemia  " Precipitous drop in Hematocrit  " Other Condition  " Cannot Clinically Determine   Supporting Information:  Signs and Symptoms: EBL per Anesthesia record 01/03/12.  Diagnostics: H&H on 4/29(Anesthesia record): 11.2/33 @ 1625  8.8/26 @  1815  03/10/21 @ 1949 H&H on 4/30: 10.5/30.2  Transfusion: Received PRBC's per Anesthesia record, 01/03/12. IV fluids / plasma expanders: Received Hextend per Anesthesia  record, 01/03/12.   Reviewed: additional documentation in the medical record  Thank You,  Marciano Sequin,  Clinical Documentation Specialist:  Pager: [phone]  Health Information Management [place]

## 2012-01-05 LAB — POCT I-STAT 4, (NA,K, GLUC, HGB,HCT)
Glucose, Bld: 85 mg/dL (ref 70–99)
HCT: 26 % — ABNORMAL LOW (ref 36.0–46.0)
Hemoglobin: 11.2 g/dL — ABNORMAL LOW (ref 12.0–15.0)
Hemoglobin: 8.8 g/dL — ABNORMAL LOW (ref 12.0–15.0)
Potassium: 4.1 mEq/L (ref 3.5–5.1)
Sodium: 141 mEq/L (ref 135–145)
Sodium: 140 mEq/L (ref 135–145)

## 2012-01-05 MED ORDER — CARVEDILOL 6.25 MG PO TABS
9.3750 mg | ORAL_TABLET | Freq: Every day | ORAL | Status: DC
  Administered 2012-01-05: 9.375 mg via ORAL
  Filled 2012-01-05 (×2): qty 1

## 2012-01-05 NOTE — Progress Notes (Addendum)
[facility] Progress Note  01/05/2012 7:36 AM POD 2  Subjective:  "Right leg is feeling a little better".  States she has been OOB and ambulating .  Tm 99.3 Filed Vitals:   01/05/12 0502  BP: 119/45  Pulse: 90  Temp: 99.3 F (37.4 C)  Resp: 20    Physical Exam: Incisions: right below knee incision is c/d/i without drainage.  Right ankle incision with sutures intact and no drainage. B groin soft without hematoma.  Some ecchymosis. Extremities:  Right foot warm.   CBC    Component Value Date/Time   WBC 6.9 01/04/2012 0450   RBC 3.33* 01/04/2012 0450   HGB 10.5* 01/04/2012 0450   HCT 30.2* 01/04/2012 0450   PLT 103* 01/04/2012 0450   MCV 90.7 01/04/2012 0450   MCH 31.5 01/04/2012 0450   MCHC 34.8 01/04/2012 0450   RDW 14.5 01/04/2012 0450    BMET    Component Value Date/Time   NA 139 01/04/2012 0450   K 4.2 01/04/2012 0450   CL 109 01/04/2012 0450   CO2 24 01/04/2012 0450   GLUCOSE 157* 01/04/2012 0450   BUN 22 01/04/2012 0450   CREATININE 1.12* 01/04/2012 0450   CALCIUM 7.7* 01/04/2012 0450   GFRNONAA 45* 01/04/2012 0450   GFRAA 52* 01/04/2012 0450    INR No results found for this basename: inr     Intake/Output Summary (Last 24 hours) at 01/05/12 0736 Last data filed at 01/05/12 0649  Gross per 24 hour  Intake   1050 ml  Output   1285 ml  Net   -235 ml     Assessment/Plan:  78 y.o. female is s/p embolectomy fem-tibial arteries right leg with patch angioplasty right popliteal artery  POD 2 -doing well this am -continue mobilization -d/c IVF as pt po intake is improving. -PT recommends SNF. Will consult social work.   Doreatha Massed, PA-C [facility] [phone] 01/05/2012 7:36 AM  Addendum  I have independently interviewed and examined the patient, and I agree with the physician assistant's findings.  D/W pt PT/OT rec.  Pt favors going home with services but is willing to consider SNF.  Pending social work discussion with  pt.  From vascular viewpoint, the pt can be d/c today.  In regards to CDI inquiry, the blood loss in this case is expected blood loss as part of the thrombectomy procedure.  Leonides Sake, MD [facility] [place] Office: [phone] Pager: [phone]  01/05/2012, 8:29 AM

## 2012-01-05 NOTE — Progress Notes (Signed)
Physical Therapy Treatment Patient Details Name: Terri Alvarez MRN: [id number] DOB: 11-22-1927 Today's Date: 01/05/2012 Time: 4098-1191 PT Time Calculation (min): 24 min  PT Assessment / Plan / Recommendation Comments on Treatment Session  Very motivated but still not at a safe level to d/c home without more assist. Encouraged pt to perform ankle pumps for HEP and improved ROM in her right ankle. Audible crepitus noted throughout hip? knee and ankle today with exercises but not extra pain from this, reports this just started happening a few months ago.     Follow Up Recommendations  Skilled nursing facility    Equipment Recommendations  Defer to next venue    Frequency Min 3X/week   Plan Discharge plan remains appropriate;Frequency remains appropriate    Precautions / Restrictions Precautions Precautions: Fall       Mobility  Bed Mobility Bed Mobility: Supine to Sit;Sit to Supine;Scooting to HOB Supine to Sit: 4: Min assist;HOB elevated (30 degrees) Sitting - Scoot to Edge of Bed: 5: Supervision Sit to Supine: 3: Mod assist Scooting to [facility]: 5: Set up (bed in trendelenberg position) Details for Bed Mobility Assistance: sequencing cues and facilitation for follow through to bring trunk upright; modA to bring legs to bed and assist in rotating trunk for better positioning; pt able to scoot HOB on her elbows and with partial bridge of left lower extremity (bed in trendelenberg) Transfers Sit to Stand: With upper extremity assist;4: Min assist;From bed Stand to Sit: 4: Min guard;With upper extremity assist;To bed Ambulation/Gait Ambulation/Gait Assistance: 4: Min guard Ambulation Distance (Feet): 80 Feet Assistive device: Rolling walker Ambulation/Gait Assistance Details: cues for increase stride length on left (pt dragging this toe with more fatigue), there is an audible crepitus in her knee and ankle and possibly hip during gait causing antalgia, some scissoring noted on right as  well so encouraged pt to attempt abducting at the hip to open her stance up more, she is unable to maintain this without verbal cues Gait Pattern: Decreased step length - left;Decreased hip/knee flexion - right;Decreased hip/knee flexion - left;Decreased stance time - right    Exercises General Exercises - Lower Extremity Ankle Circles/Pumps: AROM;Both;20 reps;Supine Heel Slides: AAROM;10 reps;Both;Supine Hip ABduction/ADduction: AAROM;10 reps;Both;Supine Straight Leg Raises: AAROM;Both;10 reps;Supine Heel Raises: AROM;10 reps;Both;Seated   PT Goals Acute Rehab PT Goals PT Goal: Supine/Side to Sit - Progress: Progressing toward goal PT Goal: Sit to Supine/Side - Progress: Progressing toward goal PT Goal: Sit to Stand - Progress: Progressing toward goal PT Goal: Stand to Sit - Progress: Progressing toward goal PT Goal: Ambulate - Progress: Progressing toward goal  Visit Information  Last PT Received On: 01/05/12 Assistance Needed: +1    Subjective Data  Subjective: I would love to get moving!    Cognition  Overall Cognitive Status: Appears within functional limits for tasks assessed/performed Arousal/Alertness: Awake/alert Orientation Level: Appears intact for tasks assessed Behavior During Session: [facility] for tasks performed    Balance     End of Session PT - End of Session Equipment Utilized During Treatment: Gait belt Activity Tolerance: Patient tolerated treatment well Patient left: in bed;with call bell/phone within reach;with bed alarm set Nurse Communication: Mobility status    WHITLOW,Atsushi Yom HELEN 01/05/2012, 4:56 PM

## 2012-01-05 NOTE — Progress Notes (Signed)
Pt ambulated to doorway and back to the bed twice, pt also ambulated to bathroom several times throughout day shift.  Pt tolerated well, slow steady gait.  Will continue to monitor.  Ninetta Lights RN

## 2012-01-05 NOTE — Progress Notes (Signed)
[facility]  Subjective: No CP, SOB.  Mild pain in right leg.  Objective: Vital signs in last 24 hours: Temp:  [97 F (36.1 C)-99.3 F (37.4 C)] 99.3 F (37.4 C) (05/01 0502) Pulse Rate:  [74-129] 90  (05/01 0502) Resp:  [14-20] 20  (05/01 0502) BP: (90-142)/(30-89) 119/45 mmHg (05/01 0502) SpO2:  [94 %-100 %] 96 % (05/01 0502) Last BM Date: 01/03/12  Intake/Output from previous day: 04/30 0701 - 05/01 0700 In: 1050 [I.V.:1050] Out: 1285 [Urine:1285] Intake/Output this shift:    Medications Current Facility-Administered Medications  Medication Dose Route Frequency Provider Last Rate Last Dose  . 0.9 %  sodium chloride infusion   Intravenous Continuous Amelia Jo [place], [place] 75 mL/hr at 01/04/12 2150    . acetaminophen (TYLENOL) tablet 325-650 mg  325-650 mg Oral Q4H PRN Marlowe Shores, PA       Or  . acetaminophen (TYLENOL) suppository 325-650 mg  325-650 mg Rectal Q4H PRN Marlowe Shores, PA      . acetaminophen (TYLENOL) tablet 650 mg  650 mg Oral Q4H PRN Runell Gess, MD      . bisacodyl (DULCOLAX) suppository 10 mg  10 mg Rectal Daily PRN Marlowe Shores, PA      . carvedilol (COREG) tablet 6.25 mg  6.25 mg Oral Daily Runell Gess, MD   6.25 mg at 01/04/12 0933  . cephALEXin (KEFLEX) capsule 500 mg  500 mg Oral BID Runell Gess, MD      . clopidogrel (PLAVIX) tablet 300 mg  300 mg Oral Once Fransisco Hertz, MD   300 mg at 01/04/12 0933  . clopidogrel (PLAVIX) tablet 75 mg  75 mg Oral Q breakfast Fransisco Hertz, MD   75 mg at 01/05/12 0736  . docusate sodium (COLACE) capsule 100 mg  100 mg Oral Daily Amelia Jo [place], [place]   100 mg at 01/04/12 0935  . furosemide (LASIX) tablet 20 mg  20 mg Oral Daily Runell Gess, MD   20 mg at 01/04/12 0935  . guaiFENesin-dextromethorphan (ROBITUSSIN DM) 100-10 MG/5ML syrup 15 mL  15 mL Oral Q4H PRN Marlowe Shores, PA      . hydrALAZINE (APRESOLINE) injection 10 mg  10 mg Intravenous Q2H  PRN Marlowe Shores, [place]      . HYDROmorphone (DILAUDID) injection 0.5-1 mg  0.5-1 mg Intravenous Q3H PRN Marlowe Shores, PA   0.5 mg at 01/04/12 0223  . labetalol (NORMODYNE,TRANDATE) injection 10 mg  10 mg Intravenous Q2H PRN Marlowe Shores, PA      . lisinopril (PRINIVIL,ZESTRIL) tablet 20 mg  20 mg Oral Daily Runell Gess, MD   20 mg at 01/04/12 0933  . metoprolol (LOPRESSOR) injection 2-5 mg  2-5 mg Intravenous Q2H PRN Marlowe Shores, PA      . ondansetron [facility]) injection 4 mg  4 mg Intravenous Q6H PRN Marlowe Shores, PA      . oxyCODONE-acetaminophen (PERCOCET) 5-325 MG per tablet 1-2 tablet  1-2 tablet Oral Q4H PRN Marlowe Shores, PA   1 tablet at 01/05/12 [phone]  . phenol (CHLORASEPTIC) mouth spray 1 spray  1 spray Mouth/Throat PRN Marlowe Shores, PA      . polyethylene glycol (MIRALAX / GLYCOLAX) packet 17 g  17 g Oral Daily PRN Marlowe Shores, PA      . potassium chloride SA (K-DUR,KLOR-CON) CR tablet 20-40 mEq  20-40 mEq Oral Daily  PRN Marlowe Shores, PA      . traMADol Janean Sark) tablet 50 mg  50 mg Oral Q6H PRN Runell Gess, MD   50 mg at 01/03/12 2331  . DISCONTD: aspirin EC tablet 81 mg  81 mg Oral Daily Runell Gess, MD      . DISCONTD: cefUROXime (ZINACEF) 1.5 g in dextrose 5 % 50 mL IVPB  1.5 g Intravenous Q12H Amelia Jo [place], [place]      . DISCONTD: dextrose 5 %-0.45 % sodium chloride infusion   Intravenous Continuous Amelia Jo [place], PA 100 mL/hr at 01/04/12 0700    . DISCONTD: DOPamine (INTROPIN) 800 mg in dextrose 5 % 250 mL infusion  3-5 mcg/kg/min Intravenous Continuous Marlowe Shores, [place]      . DISCONTD: insulin aspart (novoLOG) injection 0-9 Units  0-9 Units Subcutaneous Q4H Willene Hatchet, RN        PE: General appearance: alert, cooperative and no distress Lungs: clear to auscultation bilaterally Heart: Reg rhythm,  rate elevated.  No MM Extremities: No LEE Pulses: Radials 2+ and symmetric.  DPs 1+ Right Groin:   Nontender, No bruit.  +Ecchymosis   Lab Results:   Basename 01/04/12 0450 01/03/12 1946 01/03/12 1809  WBC 6.9 -- --  HGB 10.5* 7.5* 8.8*  HCT 30.2* 22.0* 26.0*  PLT 103* -- --   BMET  Basename 01/04/12 0450 01/03/12 1946 01/03/12 1809  NA 139 142 140  K 4.2 4.1 4.0  CL 109 -- --  CO2 24 -- --  GLUCOSE 157* 86 85  BUN 22 -- --  CREATININE 1.12* -- --  CALCIUM 7.7* -- --    Studies/Results: @RISRSLT2 @   Assessment/Plan  Active Problems:  Critical lower limb ischemia, rt. foot.  Abnormal ankle brachial index, rt. infrapopliteal disease, high grade Rt. SFA disease.  Dissection of other artery, rt. SFA     S/P angioplasty, patch of rt. below the knee popliteal artery , 01/03/12  and rt. pop. ant tib. and peroneal artery thrombectomy.  HTN (hypertension)  Hyperlipemia  DM (diabetes mellitus), type 2, non insulin depent.  Abnormal TSH  Plan:  POD#2 S/P Right popliteal, anterior tibial, and peroneal artery thrombectomy. Patch angioplasty of right below-the-knee popliteal artery.  Right common femoral artery cannulation with ultrasound guidance. Right leg angiogram. Distal anterior tibial exposure and thrombectomy  PT and OT recommend SNF.  The patient does not want to go to SNF.  She states she has someone who comes over threes hours per day.  Not adequate.  Daughter is concerned about lack of supervision.  Will follow PT/OT recommendations. HR is elevated ~100. BP: 90/30 - 142/47 may not tolerate increase in Coreg.   LOS: 2 days    HAGER,BRYAN W 01/05/2012 7:40 AM   Patient seen and examined. Agree with assessment and plan. No CP or SOB. No leg discomfort presently.  Sinus tach at 110; will try to increase carvedilol to 9.375 mg bid if BP allows.   Lennette Bihari, MD, [facility] 01/05/2012 8:30 AM

## 2012-01-05 NOTE — Progress Notes (Signed)
Utilization review completed. Ellington Cornia, RN, BSN.    01/05/12  

## 2012-01-06 ENCOUNTER — Telehealth: Payer: Self-pay | Admitting: Vascular Surgery

## 2012-01-06 MED ORDER — CARVEDILOL 12.5 MG PO TABS
12.5000 mg | ORAL_TABLET | Freq: Two times a day (BID) | ORAL | Status: DC
  Administered 2012-01-06: 12.5 mg via ORAL
  Filled 2012-01-06 (×2): qty 1

## 2012-01-06 MED ORDER — CARVEDILOL 12.5 MG PO TABS: 12.5000 mg | ORAL_TABLET | Freq: Two times a day (BID) | ORAL | Status: DC

## 2012-01-06 MED ORDER — CLOPIDOGREL BISULFATE 75 MG PO TABS: 75.0000 mg | ORAL_TABLET | Freq: Every day | ORAL | Status: AC

## 2012-01-06 MED ORDER — CARVEDILOL 12.5 MG PO TABS
12.5000 mg | ORAL_TABLET | Freq: Two times a day (BID) | ORAL | Status: DC
  Administered 2012-01-07: 12.5 mg via ORAL
  Filled 2012-01-06 (×3): qty 1

## 2012-01-06 NOTE — Progress Notes (Signed)
[facility]  Subjective: Right leg sore.  Objective: Vital signs in last 24 hours: Temp:  [98.1 F (36.7 C)-98.8 F (37.1 C)] 98.1 F (36.7 C) (05/02 0509) Pulse Rate:  [88-100] 88  (05/02 0509) Resp:  [16-18] 16  (05/02 0509) BP: (113-131)/(54-65) 113/65 mmHg (05/02 0509) SpO2:  [93 %-99 %] 97 % (05/02 0509) Last BM Date: 01/03/12  Intake/Output from previous day: 05/01 0701 - 05/02 0700 In: 480 [P.O.:480] Out: 1100 [Urine:1100] Intake/Output this shift:    Medications Current Facility-Administered Medications  Medication Dose Route Frequency Provider Last Rate Last Dose  . acetaminophen (TYLENOL) tablet 325-650 mg  325-650 mg Oral Q4H PRN Marlowe Shores, PA       Or  . acetaminophen (TYLENOL) suppository 325-650 mg  325-650 mg Rectal Q4H PRN Marlowe Shores, PA      . acetaminophen (TYLENOL) tablet 650 mg  650 mg Oral Q4H PRN Runell Gess, MD      . bisacodyl (DULCOLAX) suppository 10 mg  10 mg Rectal Daily PRN Marlowe Shores, PA      . carvedilol (COREG) tablet 9.375 mg  9.375 mg Oral Daily Lennette Bihari, MD   9.375 mg at 01/05/12 1107  . cephALEXin (KEFLEX) capsule 500 mg  500 mg Oral BID Runell Gess, MD   500 mg at 01/05/12 2104  . clopidogrel (PLAVIX) tablet 75 mg  75 mg Oral Q breakfast Fransisco Hertz, MD   75 mg at 01/06/12 0748  . docusate sodium (COLACE) capsule 100 mg  100 mg Oral Daily Marlowe Shores, PA   100 mg at 01/05/12 1104  . furosemide (LASIX) tablet 20 mg  20 mg Oral Daily Runell Gess, MD   20 mg at 01/05/12 1104  . guaiFENesin-dextromethorphan (ROBITUSSIN DM) 100-10 MG/5ML syrup 15 mL  15 mL Oral Q4H PRN Marlowe Shores, PA      . hydrALAZINE (APRESOLINE) injection 10 mg  10 mg Intravenous Q2H PRN Marlowe Shores, [place]      . HYDROmorphone (DILAUDID) injection 0.5-1 mg  0.5-1 mg Intravenous Q3H PRN Marlowe Shores, PA   0.5 mg at 01/04/12 0223  . labetalol (NORMODYNE,TRANDATE) injection 10 mg  10  mg Intravenous Q2H PRN Marlowe Shores, PA      . lisinopril (PRINIVIL,ZESTRIL) tablet 20 mg  20 mg Oral Daily Runell Gess, MD   20 mg at 01/05/12 1104  . metoprolol (LOPRESSOR) injection 2-5 mg  2-5 mg Intravenous Q2H PRN Marlowe Shores, PA      . ondansetron [facility]) injection 4 mg  4 mg Intravenous Q6H PRN Marlowe Shores, PA      . oxyCODONE-acetaminophen (PERCOCET) 5-325 MG per tablet 1-2 tablet  1-2 tablet Oral Q4H PRN Marlowe Shores, PA   2 tablet at 01/06/12 [phone]  . phenol (CHLORASEPTIC) mouth spray 1 spray  1 spray Mouth/Throat PRN Marlowe Shores, PA      . polyethylene glycol (MIRALAX / GLYCOLAX) packet 17 g  17 g Oral Daily PRN Marlowe Shores, PA      . potassium chloride SA (K-DUR,KLOR-CON) CR tablet 20-40 mEq  20-40 mEq Oral Daily PRN Marlowe Shores, PA      . traMADol Janean Sark) tablet 50 mg  50 mg Oral Q6H PRN Runell Gess, MD   50 mg at 01/03/12 2331  . DISCONTD: 0.9 %  sodium chloride infusion   Intravenous Continuous Marlowe Shores,  PA      . DISCONTD: carvedilol (COREG) tablet 6.25 mg  6.25 mg Oral Daily Runell Gess, MD   6.25 mg at 01/04/12 0933    PE: General appearance: alert, cooperative and no distress Lungs: clear to auscultation bilaterally Heart: rate a little fast and reg rhythm, No MM Extremities: No LEE. Pulses: 2+ and symmetric. DPs 1+  Lab Results:   Basename 01/04/12 0450 01/03/12 1946 01/03/12 1809  WBC 6.9 -- --  HGB 10.5* 7.5* 8.8*  HCT 30.2* 22.0* 26.0*  PLT 103* -- --   BMET  Basename 01/04/12 0450 01/03/12 1946 01/03/12 1809  NA 139 142 140  K 4.2 4.1 4.0  CL 109 -- --  CO2 24 -- --  GLUCOSE 157* 86 85  BUN 22 -- --  CREATININE 1.12* -- --  CALCIUM 7.7* -- --   Assessment/Plan   Active Problems:  Critical lower limb ischemia, rt. foot.  Abnormal ankle brachial index, rt. infrapopliteal disease, high grade Rt. SFA disease.  Dissection of other artery, rt. SFA     S/P angioplasty, patch of rt. below  the knee popliteal artery , 01/03/12  and rt. pop. ant tib. and peroneal artery thrombectomy.  HTN (hypertension)  Hyperlipemia  DM (diabetes mellitus), type 2, non insulin depent.  Abnormal TSH  Plan:  Coreg increased to 9.375mg  yesterday.  BP 113/65 - 131/62.  HR Sinus Tach 104 on telemetry.  HR 98 after ambulation from the bathroom to the bed.  Awaiting SNF placement.      LOS: 3 days    HAGER,BRYAN W 01/06/2012 8:17 AM   Patient seen and examined. Agree with assessment and plan. Tolerating increased Coreg; ST better but persists, will titrate to 12.5 mg bid. Awaiting SNF placement.  Lennette Bihari, MD, [facility] 01/06/2012 8:40 AM

## 2012-01-06 NOTE — Progress Notes (Signed)
Physical Therapy Treatment Patient Details Name: Terri Alvarez MRN: [id number] DOB: 1928/01/13 Today's Date: 01/06/2012 Time: 1040-1105 PT Time Calculation (min): 25 min  PT Assessment / Plan / Recommendation Comments on Treatment Session  Patient s/p angioplasty, patch of rt. below the knee popliteal artery , 01/03/12  and rt. pop. ant tib. and peroneal artery thrombectomy.  Patient limited by some pain and decr endurance.  Continute PT.      Follow Up Recommendations  Skilled nursing facility    Equipment Recommendations  Defer to next venue    Frequency Min 3X/week   Plan Discharge plan remains appropriate;Frequency remains appropriate    Precautions / Restrictions Precautions Precautions: Fall Restrictions Weight Bearing Restrictions: No   Pertinent Vitals/Pain VSS/ 4/10 leg pain    Mobility  Bed Mobility Bed Mobility: Rolling Left;Left Sidelying to Sit;Sitting - Scoot to Edge of Bed Rolling Left: 5: Supervision;With rail Left Sidelying to Sit: 5: Supervision;With rails Sitting - Scoot to Edge of Bed: 5: Supervision Sit to Supine: Not Tested (comment) Scooting to [facility]: Not tested (comment) Details for Bed Mobility Assistance: Took incr time however patient able to complete with min use of rail.   Transfers Transfers: Sit to Stand;Stand to Sit Sit to Stand: 4: Min guard;With upper extremity assist;From bed Stand to Sit: 4: Min guard;With upper extremity assist;With armrests;To chair/3-in-1 Details for Transfer Assistance: cuing for hand placement and sequencing.  Patient needed incr time to complete sit to stand.   Ambulation/Gait Ambulation/Gait Assistance: 4: Min guard Ambulation Distance (Feet): 200 Feet Assistive device: Rolling walker Ambulation/Gait Assistance Details: cues for incr stride length constantly on right and left.  Audible crepitus in hip or knee still nnoted.   Gait Pattern: Decreased step length - right;Decreased step length - left;Decreased stride  length;Decreased hip/knee flexion - left;Decreased stance time - right;Antalgic;Shuffle;Trunk flexed Stairs: No Wheelchair Mobility Wheelchair Mobility: No         PT Goals Acute Rehab PT Goals PT Goal: Supine/Side to Sit - Progress: Progressing toward goal PT Goal: Sit to Supine/Side - Progress: Progressing toward goal PT Goal: Sit to Stand - Progress: Progressing toward goal PT Goal: Stand to Sit - Progress: Progressing toward goal PT Goal: Ambulate - Progress: Progressing toward goal  Visit Information  Last PT Received On: 01/06/12 Assistance Needed: +1    Subjective Data  Subjective: "I will walk til I am tired."   Cognition  Overall Cognitive Status: Appears within functional limits for tasks assessed/performed Arousal/Alertness: Awake/alert Orientation Level: Appears intact for tasks assessed Behavior During Session: [facility] for tasks performed    Balance     End of Session PT - End of Session Equipment Utilized During Treatment: Gait belt Activity Tolerance: Patient tolerated treatment well Patient left: in chair;with call bell/phone within reach Nurse Communication: Mobility status    [name],Dayne Dekay 01/06/2012, 12:03 PM  Stori Royse [name],PT Acute Rehabilitation [phone] (pager)

## 2012-01-06 NOTE — Telephone Encounter (Signed)
Message copied by Sara Chu on Thu Jan 06, 2012 10:22 AM ------      Message from: [place], [place] K      Created: Thu Jan 06, 2012  9:53 AM      Regarding: schedule                   ----- Message -----         From: Dara Lords, PA         Sent: 01/06/2012   9:43 AM           To: Sharee Pimple, CMA            This pt needs to come back next Friday during [facility] to have her sutures removed.            She is s/p s/p R pop and tibial TE, patch angioplasty pop artery                  She is gonna be d/c'd to a skilled facility sometime soon.            Thanks,      Lelon Mast

## 2012-01-06 NOTE — Clinical Social Work Psychosocial (Signed)
     Clinical Social Work Department BRIEF PSYCHOSOCIAL ASSESSMENT 01/06/2012  Patient:  Terri Alvarez, Terri Alvarez     Account Number:  [id number]     Admit date:  01/03/2012  Clinical Social Worker:  Peggyann Shoals  Date/Time:  01/06/2012 03:30 PM  Referred by:  Physician  Date Referred:  01/05/2012 Referred for  SNF Placement   Other Referral:   Interview type:  Patient Other interview type:    PSYCHOSOCIAL DATA Living Status:  ALONE Admitted from facility:   Level of care:   Primary support name:  Loretha Stapler Primary support relationship to patient:  SIBLING Degree of support available:   Adequate. Pt's emergency contact, [phone].    CURRENT CONCERNS Current Concerns  Post-Acute Placement   Other Concerns:    SOCIAL WORK ASSESSMENT / PLAN CSW met with pt to address consult. PT is recommending SNF at discharge. Pt lives alone and has an Geophysicist/field seismologist come into the home to assist pt 3 hours a day/7 days a week. Pt shared that she would like to go to SNF in [place]. CSW will initiate SNF search in [facility] and follow up with bed offers. CSW will continue to follow to facilitate discharge to SNF.   Assessment/plan status:  Psychosocial Support/Ongoing Assessment of Needs Other assessment/ plan:   Information/referral to community resources:   As needed.    PATIENTS/FAMILYS RESPONSE TO PLAN OF CARE: Pt was pleasant and alert and oriented. Pt is agreeable to discharge plan and shared that she will be signing herself in.

## 2012-01-06 NOTE — Telephone Encounter (Signed)
Spoke with patient and home nurse to confirm appt date and time.

## 2012-01-06 NOTE — Progress Notes (Signed)
[facility]  Daily Progress Note  Assessment/Planning: POD #3 s/p R pop and tibial TE, patch angioplasty pop artery   PT still recommends SNF w/ rehab.  I also recommended to this the pt.  She seems more amendable today.  Awaiting placement in SNF if pt willing.  If not, home with services  Sutures out of anterior shin incision in 1 week  Subjective  - 3 Days Post-Op  No complaints, moving more  Objective Filed Vitals:   01/05/12 1104 01/05/12 1507 01/05/12 2038 01/06/12 0509  BP: 116/54 124/62 131/62 113/65  Pulse:  96 100 88  Temp:  98.6 F (37 C) 98.8 F (37.1 C) 98.1 F (36.7 C)  TempSrc:  Oral Oral Oral  Resp:  18 18 16   Height:      Weight:      SpO2:  99% 93% 97%    Intake/Output Summary (Last 24 hours) at 01/06/12 0752 Last data filed at 01/05/12 2115  Gross per 24 hour  Intake    480 ml  Output   1100 ml  Net   -620 ml    PULM  CTAB CV  RRR GI  soft, NTND VASC  R foot warm, ant shin inc c/d/i, medial calf incision c/d/i  Laboratory CBC    Component Value Date/Time   WBC 6.9 01/04/2012 0450   HGB 10.5* 01/04/2012 0450   HCT 30.2* 01/04/2012 0450   PLT 103* 01/04/2012 0450    BMET    Component Value Date/Time   NA 139 01/04/2012 0450   K 4.2 01/04/2012 0450   CL 109 01/04/2012 0450   CO2 24 01/04/2012 0450   GLUCOSE 157* 01/04/2012 0450   BUN 22 01/04/2012 0450   CREATININE 1.12* 01/04/2012 0450   CALCIUM 7.7* 01/04/2012 0450   GFRNONAA 45* 01/04/2012 0450   GFRAA 52* 01/04/2012 0450    Leonides Sake, MD [facility] Office: [phone] Pager: [phone]  01/06/2012, 7:52 AM

## 2012-01-06 NOTE — Progress Notes (Signed)
[facility]  Progress Note Bypass Surgery  Date of Surgery: 01/03/2012  Procedure(s): EMBOLECTOMY Right fem-tibial LOWER EXTREMITY ANGIOGRAM Surgeon: Surgeon(s): Fransisco Hertz, MD  3 Days Post-Op  History of Present Illness  Terri Alvarez is a 78 y.o. female who is doing well S/P surgery. C/O soreness in the leg but has no calf or ant compartment tenderness Ambulating with walker Imaging: No results found.  Significant Diagnostic Studies: CBC Lab Results  Component Value Date   WBC 6.9 01/04/2012   HGB 10.5* 01/04/2012   HCT 30.2* 01/04/2012   MCV 90.7 01/04/2012   PLT 103* 01/04/2012    BMET     Component Value Date/Time   NA 139 01/04/2012 0450   K 4.2 01/04/2012 0450   CL 109 01/04/2012 0450   CO2 24 01/04/2012 0450   GLUCOSE 157* 01/04/2012 0450   BUN 22 01/04/2012 0450   CREATININE 1.12* 01/04/2012 0450   CALCIUM 7.7* 01/04/2012 0450   GFRNONAA 45* 01/04/2012 0450   GFRAA 52* 01/04/2012 0450     Physical Examination  BP Readings from Last 3 Encounters:  01/06/12 113/65  01/06/12 113/65  01/06/12 113/65   Temp Readings from Last 3 Encounters:  01/06/12 98.1 F (36.7 C) Oral  01/06/12 98.1 F (36.7 C) Oral  01/06/12 98.1 F (36.7 C) Oral   SpO2 Readings from Last 3 Encounters:  01/06/12 97%  01/06/12 97%  01/06/12 97%   Pulse Readings from Last 3 Encounters:  01/06/12 88  01/06/12 88  01/06/12 88    Pt is A&O x 3 right lower extremity: Incision/s is/are clean,dry.intact, and  healing without hematoma, erythema or drainage Limb is warm; with good color Calf and anterior compartment soft non tender  Assessment/Plan: Pt. Doing well Post-op pain is controlled Wounds are healing well PT/OT for ambulation Continue wound care as ordered  Marlowe Shores [phone] 01/06/2012 7:51 AM

## 2012-01-07 NOTE — Progress Notes (Signed)
CARE MANAGEMENT NOTE 01/07/2012  Patient:  Terri Alvarez, Terri Alvarez   Account Number:  [id number]  Date Initiated:  01/07/2012  Documentation initiated by:  Vance Peper  Subjective/Objective Assessment:     Action/Plan:   Patient is for shortterm rehab at [facility]. Social Worker aware and has spoken with patient and family.   Anticipated DC Date:  01/10/2012   Anticipated DC Plan:  SKILLED NURSING FACILITY  In-house referral  Clinical Social Worker      DC Planning Services  CM consult      Choice offered to / List presented to:             Status of service:  Completed, signed off   Discharge Disposition:  SKILLED NURSING FACILITY

## 2012-01-07 NOTE — Clinical Social Work Placement (Addendum)
Clinical Social Work Department CLINICAL SOCIAL WORK PLACEMENT NOTE 01/07/2012  Patient:  Terri Alvarez, Terri Alvarez  Account Number:  [id number] Admit date:  01/03/2012  Clinical Social Worker:  Genelle Bal, LCSW  Date/time:  01/07/2012 10:07 AM  Clinical Social Work is seeking post-discharge placement for this patient at the following level of care:   SKILLED NURSING   (*CSW will update this form in Epic as items are completed)   01/06/2012  Patient/family provided with [facility] Department of Clinical Social Work's list of facilities offering this level of care within the geographic area requested by the patient (or if unable, by the patient's family).  01/06/2012  Patient/family informed of their freedom to choose among providers that offer the needed level of care, that participate in Medicare, Medicaid or managed care program needed by the patient, have an available bed and are willing to accept the patient.    Patient/family informed of [facility]' ownership interest in [facility], as well as of the fact that they are under no obligation to receive care at this facility.  PASARR submitted to EDS on 01/07/2012 PASARR number received from EDS on 01/07/2012  FL2 transmitted to all facilities in geographic area requested by pt/family on  01/07/2012 FL2 transmitted to all facilities within larger geographic area on   Patient informed that his/her managed care company has contracts with or will negotiate with  certain facilities, including the following:     Patient/family informed of bed offers received:  01/07/2012 Patient chooses bed at [facility]  Physician recommends and patient chooses bed at    Patient to be transferred to [facility]   Patient to be transferred to facility by Ambulance  The following physician request were entered in Epic:   Additional Comments:

## 2012-01-07 NOTE — Progress Notes (Addendum)
[facility] Progress Note  01/07/2012 7:25 AM POD 4  Subjective:  Still a little sore in her right leg  Afebrile x 24 hours Filed Vitals:   01/07/12 0518  BP: 122/63  Pulse: 96  Temp: 98.3 F (36.8 C)  Resp: 20    Physical Exam: Incisions:  Below knee incision is c/d/i.  Ant shin incision is with sutures in tact and not drainage. Extremities:  BLE warm and well perfused.  There is minimal edema in the RLE  CBC    Component Value Date/Time   WBC 6.9 01/04/2012 0450   RBC 3.33* 01/04/2012 0450   HGB 10.5* 01/04/2012 0450   HCT 30.2* 01/04/2012 0450   PLT 103* 01/04/2012 0450   MCV 90.7 01/04/2012 0450   MCH 31.5 01/04/2012 0450   MCHC 34.8 01/04/2012 0450   RDW 14.5 01/04/2012 0450    BMET    Component Value Date/Time   NA 139 01/04/2012 0450   K 4.2 01/04/2012 0450   CL 109 01/04/2012 0450   CO2 24 01/04/2012 0450   GLUCOSE 157* 01/04/2012 0450   BUN 22 01/04/2012 0450   CREATININE 1.12* 01/04/2012 0450   CALCIUM 7.7* 01/04/2012 0450   GFRNONAA 45* 01/04/2012 0450   GFRAA 52* 01/04/2012 0450    INR No results found for this basename: inr     Intake/Output Summary (Last 24 hours) at 01/07/12 0725 Last data filed at 01/07/12 0533  Gross per 24 hour  Intake   1200 ml  Output   2300 ml  Net  -1100 ml     Assessment/Plan:  78 y.o. female is s/p embolectomy fem-tibial arteries right leg with patch angioplasty right popliteal artery  POD 4 -for d/c to SNF when bed available for further PT/OT -continue mobilization -will be d/c'd home on plavix and pletal.  She does not need ASA at this point per Dr. Imogene Burn. -f/u in one week for suture removal.   Doreatha Massed, PA-C [facility] [phone] 01/07/2012 7:25 AM  Addendum  I have independently interviewed and examined the patient, and I agree with the physician assistant's findings.  Patient can be discharged on SNF placement complete.  Leonides Sake, MD [facility] of  [place] Office: [phone] Pager: [phone]  01/07/2012, 7:52 AM

## 2012-01-07 NOTE — Progress Notes (Signed)
Occupational Therapy Treatment Patient Details Name: Terri Alvarez MRN: [id number] DOB: 08/18/1928 Today's Date: 01/07/2012 Time: 4098-1191 OT Time Calculation (min): 23 min  OT Assessment / Plan / Recommendation Comments on Treatment Session Pt progressing. Continue to recommend SNF    Follow Up Recommendations  Skilled nursing facility    Equipment Recommendations  Defer to next venue    Frequency     Plan Discharge plan remains appropriate    Precautions / Restrictions Precautions Precautions: Fall Restrictions Weight Bearing Restrictions: No   Pertinent Vitals/Pain Pt c/o 4-5/10 in RLE, but denies need for pain medicine at this time.    ADL  Grooming: Performed;Supervision/safety;Wash/dry face;Teeth care Where Assessed - Grooming: Standing at sink Lower Body Dressing: Maximal assistance;Simulated Where Assessed - Lower Body Dressing: Sit to stand from bed Toilet Transfer: Performed;Supervision/safety Toilet Transfer Method: Proofreader: Bedside commode Toileting - Clothing Manipulation: Performed;Minimal assistance Where Assessed - Toileting Clothing Manipulation: Standing Ambulation Related to ADLs: Min guard A with RW ambulation. Pt continues with left foot drag. Encouraged pt to take BIG steps with left leg, after about 3-5steps, pt would need a reminder to continue this. Pt especially at risk of falls when turning right, as left foot gets tangled in right if she does not remember to advance it. Practiced right turns x 4 until pt able to advance LLE with no VC    OT Goals ADL Goals ADL Goal: Grooming - Progress: Progressing toward goals ADL Goal: Lower Body Dressing - Progress: Progressing toward goals ADL Goal: Toilet Transfer - Progress: Progressing toward goals  Visit Information  Last OT Received On: 01/07/12 Assistance Needed: +1               Cognition  Overall Cognitive Status: Appears within functional limits for tasks  assessed/performed Behavior During Session: Flat affect Cognition - Other Comments: unsure if slow processing?    Mobility Transfers Sit to Stand: 4: Min guard;From bed;With upper extremity assist Stand to Sit: 5: Supervision;With upper extremity assist;To bed Details for Transfer Assistance: Initially required cues for hand placement but recalled correct placement on second and third sit to stand from bed           End of Session OT - End of Session Equipment Utilized During Treatment: Gait belt Activity Tolerance: Patient tolerated treatment well Patient left: with call bell/phone within reach;in bed;with bed alarm set   Feliberto Stockley 01/07/2012, 10:11 AM

## 2012-01-07 NOTE — Discharge Summary (Signed)
[facility] Discharge Summary  Anisah Kuck 1928/04/23 78 y.o. female  [id number]  Admission Date: 01/03/2012  Discharge Date: 01/07/12  Physician: Fransisco Hertz, MD  Admission Diagnosis: Claudication clotted vessel   HPI:   This is a 78 y.o. female who presented earlier in the cath lab with critical limb ischemia in right leg. Dr. Allyson Sabal performed an orbital atherectomy and angioplasty of right superficial femoral artery and popliteal artery. Intra-procedure, loss of tibial blood flow was identified. Possible embolism to the right tibial arteries was diagnosed. As the patient had acute limb ischemia from the embolism, I felt emergent intervention was necessary. We discussed performing a right leg thrombectomy, possible femoral to popliteal vs tibial bypass, and possible other procedures as indicated. The risk, benefits, and alternatives were discussed with the patient. The patient is aware the risks include but are not limited to: bleeding, infection, myocardial infarction, stroke, limb loss, nerve damage, need for additional procedures in the future, wound complications, and inability to complete the bypass. The patient is aware of these risks and agreed to proceed.   Hospital Course:  The patient was admitted to the hospital and taken to thecath lab on 01/03/2012 and underwent  orbital atherectomy and angioplasty of right superficial femoral artery and popliteal artery.  Intra-procedure, loss of tibial blood flow was identified. Possible embolism to the right tibial arteries was diagnosed. As the patient had acute limb ischemia from the embolism, Dr. Imogene Burn felt emergent intervention was necessary.  She was then taken to the operating room and underwent:  1. Right popliteal, anterior tibial, and peroneal artery thrombectomy  2. Patch angioplasty of right below-the-knee popliteal artery  3. Right common femoral artery cannulation with ultrasound guidance  4. Right leg  angiogram  5. Distal anterior tibial exposure and thrombectomy   The pt tolerated the procedure well and was transported to the PACU in good condition.   By POD 1, she was doing well and her pre-op symptoms were improved.  PT was consulted and they recommend SNF after discharge.  Pt is placed on Plavix and Pletal indefinitely at this point.  She does not need ASA at this time per Dr. Imogene Burn.  The remainder of the hospital course consisted of increasing ambulation and increasing intake of solids without difficulty.  CBC    Component Value Date/Time   WBC 6.9 01/04/2012 0450   RBC 3.33* 01/04/2012 0450   HGB 10.5* 01/04/2012 0450   HCT 30.2* 01/04/2012 0450   PLT 103* 01/04/2012 0450   MCV 90.7 01/04/2012 0450   MCH 31.5 01/04/2012 0450   MCHC 34.8 01/04/2012 0450   RDW 14.5 01/04/2012 0450    BMET    Component Value Date/Time   NA 139 01/04/2012 0450   K 4.2 01/04/2012 0450   CL 109 01/04/2012 0450   CO2 24 01/04/2012 0450   GLUCOSE 157* 01/04/2012 0450   BUN 22 01/04/2012 0450   CREATININE 1.12* 01/04/2012 0450   CALCIUM 7.7* 01/04/2012 0450   GFRNONAA 45* 01/04/2012 0450   GFRAA 52* 01/04/2012 0450     Discharge Instructions:   The patient is discharged to home with extensive instructions on wound care and progressive ambulation.  They are instructed not to drive or perform any heavy lifting until returning to see the physician in his office.  Discharge Orders    Future Appointments: Provider: Department: Dept Phone: Center:   01/14/2012 10:00 AM Fransisco Hertz, MD [facility] [phone] [facility]     Future Orders  Please Complete By Expires   Resume previous diet      Lifting restrictions      Comments:   No lifting for 6 weeks   Call MD for:  temperature >100.5      Call MD for:  redness, tenderness, or signs of infection (pain, swelling, bleeding, redness, odor or green/yellow discharge around incision site)      Call MD for:  severe or increased pain, loss or decreased feeling  in  affected limb(s)      may wash over wound with mild soap and water      Scheduling Instructions:   Shower daily with soap and water starting 01/07/12.  After showering, cover anterior lower leg incision with dry gauze.       Discharge Diagnosis:  Claudication clotted vessel  Secondary Diagnosis: Patient Active Problem List  Diagnoses  . Critical lower limb ischemia, rt. foot.  . Abnormal ankle brachial index, rt. infrapopliteal disease, high grade Rt. SFA disease.  . Dissection of other artery, rt. SFA     . S/P angioplasty, patch of rt. below the knee popliteal artery , 01/03/12  and rt. pop. ant tib. and peroneal artery thrombectomy.  Marland Kitchen HTN (hypertension)  . Hyperlipemia  . DM (diabetes mellitus), type 2, non insulin depent.  . Abnormal TSH   Past Medical History  Diagnosis Date  . Hypertension   . Peripheral vascular disease   . Diabetes mellitus   . Critical lower limb ischemia, rt. foot. 01/04/2012  . Abnormal ankle brachial index, rt. infrapopliteal disease 01/04/2012  . Dissection of other artery, rt. SFA    01/04/2012  . S/P angioplasty, patch of rt. below the knee popliteal artery  01/04/2012  . HTN (hypertension) 01/04/2012  . Hyperlipemia 01/04/2012  . DM (diabetes mellitus), type 2, non insulin depent. 01/04/2012  . Abnormal TSH 01/04/2012      [place], [place]  Home Medication Instructions ZOX:[id number]   Printed on:01/07/12 0735  Medication Information                    meloxicam (MOBIC) 15 MG tablet Take 15 mg by mouth daily.           cilostazol (PLETAL) 100 MG tablet Take 100 mg by mouth daily.           furosemide (LASIX) 20 MG tablet Take 20 mg by mouth daily.           traMADol (ULTRAM) 50 MG tablet Take 50 mg by mouth every 6 (six) hours as needed. For pain           Calcium Carbonate-Vitamin D (CALCIUM + D PO) Take 1 tablet by mouth daily.           Multiple Vitamin (MULITIVITAMIN WITH MINERALS) TABS Take 1 tablet by mouth daily.             cephALEXin (KEFLEX) 500 MG capsule Take 500 mg by mouth 2 (two) times daily. For 14 days. Started 12/25/11 ending 01/08/12           lisinopril (PRINIVIL,ZESTRIL) 20 MG tablet Take 20 mg by mouth daily.           carvedilol (COREG) 12.5 MG tablet Take 1 tablet (12.5 mg total) by mouth 2 (two) times daily with a meal.           clopidogrel (PLAVIX) 75 MG tablet Take 1 tablet (75 mg total) by mouth daily with breakfast.  Disposition: SNF  Patient's condition: is Good  Follow up: 1. Dr. Imogene Burn in 1 week for suture removal.   Doreatha Massed, PA-C [facility] [phone] 01/07/2012  7:35 AM   Addendum  I have independently interviewed and examined the patient, and I agree with the physician assistant's discharge summary.  This patient unfortunately embolized after a percutaneous intervention on the right leg.  She underwent a thrombectomy of her popliteal and tibial arteries which restored flow to her right foot.  She has evidence of severe calcific atherosclerosis in this right leg and I was forced to patch angioplasty the below the knee popliteal artery.  I have counseled her that she need to reinforce her effort toward lifestyle modification in order to try to control the progression of her atherosclerosis, as I doubt this will be a good bypass candidate in the future.  Her post-operative course has been unremarkable and she will be seen in the office in 1 week for suture removal over the distal anterior tibial artery.  Leonides Sake, MD [facility] of [place] Office: [phone] Pager: [phone]  01/07/2012, 5:19 PM

## 2012-01-07 NOTE — Progress Notes (Signed)
Pt discharge instructions sent in packet to be sent with pt to SNF. IV site d/c. Site WNL. No s/s of distress. Discharged via ambulance. Dion Saucier

## 2012-01-13 ENCOUNTER — Encounter: Payer: Self-pay | Admitting: Vascular Surgery

## 2012-01-14 ENCOUNTER — Ambulatory Visit: Payer: Medicare Other | Admitting: Vascular Surgery

## 2012-01-14 ENCOUNTER — Telehealth: Payer: Self-pay | Admitting: Vascular Surgery

## 2012-01-14 NOTE — Telephone Encounter (Addendum)
Message copied by Rosalyn Charters on Fri Jan 14, 2012  3:34 PM ------      Message from: Phillips Odor      Created: Fri Jan 14, 2012 12:11 PM      Regarding: needs suture removal       Please reschedule appt for f/u appt w/ [facility] on 01/21/12.  Pt. Couldn't make appt. Today/ nursing home is taking out sutures, but pt. Needs f/u on 5/17.  Call [place] @ [phone] @ [facility] and [place].  She is very limited on times she can arrange transportation of pt.   Shantell informed of fu appt. For [facility] with Dr. Imogene Burn on 01-28-12 1:30 pm

## 2012-01-27 ENCOUNTER — Encounter: Payer: Self-pay | Admitting: Vascular Surgery

## 2012-01-28 ENCOUNTER — Ambulatory Visit (INDEPENDENT_AMBULATORY_CARE_PROVIDER_SITE_OTHER): Payer: Medicare Other | Admitting: Vascular Surgery

## 2012-01-28 ENCOUNTER — Encounter: Payer: Self-pay | Admitting: Vascular Surgery

## 2012-01-28 VITALS — BP 143/47 | HR 75 | Temp 98.3°F | Ht 60.0 in | Wt 113.0 lb

## 2012-01-28 DIAGNOSIS — I70209 Unspecified atherosclerosis of native arteries of extremities, unspecified extremity: Secondary | ICD-10-CM | POA: Insufficient documentation

## 2012-01-28 DIAGNOSIS — Z9861 Coronary angioplasty status: Secondary | ICD-10-CM

## 2012-01-28 DIAGNOSIS — Z9862 Peripheral vascular angioplasty status: Secondary | ICD-10-CM

## 2012-01-28 MED ORDER — CLOPIDOGREL BISULFATE 75 MG PO TABS: 75.0000 mg | ORAL_TABLET | Freq: Every day | ORAL | Status: DC

## 2012-01-28 MED ORDER — MELOXICAM 15 MG PO TABS: 15.0000 mg | ORAL_TABLET | Freq: Every day | ORAL | Status: DC

## 2012-01-28 NOTE — Progress Notes (Signed)
[facility] OF [place]  Postoperative Visit  History of Present Illness  Terri Alvarez is a 78 y.o. year old female who presents for postoperative follow-up for:   1. Right popliteal, anterior tibial, and peroneal artery thrombectomy  2. Patch angioplasty of right below-the-knee popliteal artery  3. Right common femoral artery cannulation with ultrasound guidance  4. Right leg angiogram  5. Distal anterior tibial exposure and thrombectomy (Date: 01/03/12).  The patient's wounds are healed.  The patient notes improvement in lower extremity symptoms.  The patient is able to complete their activities of daily living.  The patient's current symptoms are: right leg swelling.  Physical Examination  Filed Vitals:   01/28/12 1414  BP: 143/47  Pulse: 75  Temp: 98.3 F (36.8 C)   RLE: Incisions are healed, wounds are healed, warm foot, 1+ edema  Medical Decision Making  Terri Alvarez is a 78 y.o. year old female who presents s/p R popliteal, peroneal, and anterior tibial thrombectomy with popliteal patch angioplasty.  The patient's bypass incisions are healing appropriately with resolution of pre-operative symptoms.  I recommended 15-20 mm Hg compressing stockings to R leg and elevate the right leg to help with the edema in right leg. I discussed in depth with the patient the nature of atherosclerosis, and emphasized the importance of maximal medical management including strict control of blood pressure, blood glucose, and lipid levels, obtaining regular exercise, and cessation of smoking.  The patient is aware that without maximal medical management the underlying atherosclerotic disease process will progress, limiting the benefit of any interventions.  This patient's PAD is managed by Dr. Allyson Sabal, and we will her continue to follow up with him.    Thank you for allowing [place] to participate in this patient's care.  Leonides Sake, MD [facility] of  [place] Office: [phone] Pager: [phone]

## 2012-02-21 ENCOUNTER — Telehealth: Payer: Self-pay

## 2012-02-21 NOTE — Telephone Encounter (Signed)
Phone call from pt. With report of redness, swelling of toes of right foot.  States they have been this way "really since the surgery".  Denies any open areas or drainage.  States swelling goes from toes approx. 1" into the top of right foot.  Questioned pt. If she is wearing compression stockings and elevating the right leg, since seeing Dr. Imogene Burn on 5/24.  States she hasn't been wearing compression stockings, and asked how she could get a pair.  Stated she "props her feet up at times".   Advised that per Dr. Nicky Pugh office note of 5/24, she should be wearing compression stockings, 15-20 mm Hg to right leg, and elevating her leg at intervals during day.  Also, advised that Dr. Imogene Burn stated her PAD would be followed per Dr. Allyson Sabal.  Will sched appt. for pt. to be measured for compression stockings.  Advised that she should schedule any f/u appts. with Dr. Allyson Sabal re: Peripheral arterial disease.  Verb.understanding.

## 2012-02-21 NOTE — Telephone Encounter (Signed)
Notified pt of appt via phone call, dpm

## 2012-02-29 ENCOUNTER — Encounter (INDEPENDENT_AMBULATORY_CARE_PROVIDER_SITE_OTHER): Payer: Medicare Other

## 2012-02-29 DIAGNOSIS — I83893 Varicose veins of bilateral lower extremities with other complications: Secondary | ICD-10-CM

## 2012-09-18 ENCOUNTER — Other Ambulatory Visit (HOSPITAL_COMMUNITY): Payer: Self-pay | Admitting: Cardiovascular Disease

## 2012-09-18 DIAGNOSIS — I739 Peripheral vascular disease, unspecified: Secondary | ICD-10-CM

## 2012-09-18 DIAGNOSIS — R609 Edema, unspecified: Secondary | ICD-10-CM

## 2012-10-10 ENCOUNTER — Ambulatory Visit (HOSPITAL_COMMUNITY)
Admission: RE | Admit: 2012-10-10 | Discharge: 2012-10-10 | Disposition: A | Discharge status: Home / Self Care | Payer: Medicare Other | Source: Ambulatory Visit | Attending: Cardiovascular Disease | Admitting: Cardiovascular Disease

## 2012-10-10 DIAGNOSIS — R609 Edema, unspecified: Secondary | ICD-10-CM

## 2012-10-10 DIAGNOSIS — I739 Peripheral vascular disease, unspecified: Secondary | ICD-10-CM | POA: Insufficient documentation

## 2012-10-10 HISTORY — DX: Edema, unspecified: R60.9

## 2012-10-10 NOTE — Progress Notes (Signed)
Bilateral Lower ext arterial duplex completed. Terri Alvarez

## 2012-10-10 NOTE — Progress Notes (Signed)
Bilateral venous duplex completed. Terri Alvarez

## 2012-12-01 ENCOUNTER — Other Ambulatory Visit (HOSPITAL_COMMUNITY): Payer: Self-pay | Admitting: Cardiovascular Disease

## 2012-12-01 ENCOUNTER — Encounter (HOSPITAL_COMMUNITY): Payer: Self-pay | Admitting: Pharmacy Technician

## 2012-12-01 ENCOUNTER — Ambulatory Visit (HOSPITAL_COMMUNITY)
Admission: RE | Admit: 2012-12-01 | Discharge: 2012-12-01 | Disposition: A | Discharge status: Home / Self Care | Payer: Medicare Other | Source: Ambulatory Visit | Attending: Cardiovascular Disease | Admitting: Cardiovascular Disease

## 2012-12-01 DIAGNOSIS — R0989 Other specified symptoms and signs involving the circulatory and respiratory systems: Secondary | ICD-10-CM | POA: Insufficient documentation

## 2012-12-01 DIAGNOSIS — I739 Peripheral vascular disease, unspecified: Secondary | ICD-10-CM | POA: Insufficient documentation

## 2012-12-01 NOTE — Progress Notes (Signed)
Carotid Duplex completed. Terri Alvarez  

## 2012-12-08 ENCOUNTER — Other Ambulatory Visit: Payer: Self-pay | Admitting: *Deleted

## 2012-12-08 DIAGNOSIS — Z01810 Encounter for preprocedural cardiovascular examination: Secondary | ICD-10-CM

## 2012-12-14 ENCOUNTER — Other Ambulatory Visit: Payer: Self-pay | Admitting: *Deleted

## 2012-12-15 ENCOUNTER — Encounter (HOSPITAL_COMMUNITY)
Admission: RE | Disposition: A | Discharge status: Home / Self Care | Payer: Self-pay | Source: Ambulatory Visit | Attending: Cardiovascular Disease

## 2012-12-15 ENCOUNTER — Encounter (HOSPITAL_COMMUNITY): Payer: Self-pay | Admitting: General Practice

## 2012-12-15 ENCOUNTER — Other Ambulatory Visit: Payer: Self-pay | Admitting: Cardiology

## 2012-12-15 ENCOUNTER — Ambulatory Visit (HOSPITAL_COMMUNITY)
Admission: RE | Admit: 2012-12-15 | Discharge: 2012-12-16 | Disposition: A | Discharge status: Home / Self Care | Payer: Medicare Other | Source: Ambulatory Visit | Attending: Cardiovascular Disease | Admitting: Cardiovascular Disease

## 2012-12-15 DIAGNOSIS — I998 Other disorder of circulatory system: Secondary | ICD-10-CM

## 2012-12-15 DIAGNOSIS — I10 Essential (primary) hypertension: Secondary | ICD-10-CM | POA: Diagnosis present

## 2012-12-15 DIAGNOSIS — Z01810 Encounter for preprocedural cardiovascular examination: Secondary | ICD-10-CM

## 2012-12-15 DIAGNOSIS — I70229 Atherosclerosis of native arteries of extremities with rest pain, unspecified extremity: Secondary | ICD-10-CM | POA: Diagnosis present

## 2012-12-15 DIAGNOSIS — I70209 Unspecified atherosclerosis of native arteries of extremities, unspecified extremity: Secondary | ICD-10-CM | POA: Insufficient documentation

## 2012-12-15 DIAGNOSIS — E119 Type 2 diabetes mellitus without complications: Secondary | ICD-10-CM | POA: Diagnosis present

## 2012-12-15 DIAGNOSIS — E785 Hyperlipidemia, unspecified: Secondary | ICD-10-CM | POA: Diagnosis present

## 2012-12-15 DIAGNOSIS — R6889 Other general symptoms and signs: Secondary | ICD-10-CM | POA: Diagnosis present

## 2012-12-15 HISTORY — DX: Personal history of other medical treatment: Z92.89

## 2012-12-15 HISTORY — PX: SP PTA ADD PERIPHERAL: HXRAD912

## 2012-12-15 HISTORY — DX: Unspecified osteoarthritis, unspecified site: M19.90

## 2012-12-15 HISTORY — PX: LOWER EXTREMITY ANGIOGRAM: SHX5508

## 2012-12-15 LAB — POTASSIUM: Potassium: 3.9 mEq/L (ref 3.5–5.1)

## 2012-12-15 LAB — GLUCOSE, CAPILLARY
Glucose-Capillary: 359 mg/dL — ABNORMAL HIGH (ref 70–99)
Glucose-Capillary: 423 mg/dL — ABNORMAL HIGH (ref 70–99)
Glucose-Capillary: 95 mg/dL (ref 70–99)

## 2012-12-15 LAB — POCT ACTIVATED CLOTTING TIME
Activated Clotting Time: 181 seconds
Activated Clotting Time: 160 seconds

## 2012-12-15 LAB — GLUCOSE, RANDOM: Glucose, Bld: 462 mg/dL — ABNORMAL HIGH (ref 70–99)

## 2012-12-15 LAB — PROTIME-INR: Prothrombin Time: 13.6 seconds (ref 11.6–15.2)

## 2012-12-15 SURGERY — ANGIOGRAM, LOWER EXTREMITY
Laterality: Right

## 2012-12-15 MED ORDER — ASPIRIN EC 325 MG PO TBEC
325.0000 mg | DELAYED_RELEASE_TABLET | Freq: Every day | ORAL | Status: DC
  Administered 2012-12-16: 325 mg via ORAL
  Filled 2012-12-15: qty 1

## 2012-12-15 MED ORDER — SODIUM CHLORIDE 0.9 % IV SOLN
INTRAVENOUS | Status: DC
  Administered 2012-12-15: 10:00:00 via INTRAVENOUS

## 2012-12-15 MED ORDER — CARVEDILOL 6.25 MG PO TABS
6.2500 mg | ORAL_TABLET | Freq: Two times a day (BID) | ORAL | Status: DC
  Administered 2012-12-15 – 2012-12-16 (×2): 6.25 mg via ORAL
  Filled 2012-12-15 (×4): qty 1

## 2012-12-15 MED ORDER — ACETAMINOPHEN 325 MG PO TABS: 650.0000 mg | ORAL_TABLET | ORAL | Status: DC | PRN

## 2012-12-15 MED ORDER — INSULIN ASPART 100 UNIT/ML ~~LOC~~ SOLN: 0.0000 [IU] | Freq: Three times a day (TID) | SUBCUTANEOUS | Status: DC

## 2012-12-15 MED ORDER — METHYLPREDNISOLONE SODIUM SUCC 125 MG IJ SOLR
INTRAMUSCULAR | Status: AC
  Filled 2012-12-15: qty 2

## 2012-12-15 MED ORDER — DIAZEPAM 5 MG PO TABS
5.0000 mg | ORAL_TABLET | ORAL | Status: AC
  Administered 2012-12-15: 5 mg via ORAL
  Filled 2012-12-15: qty 1

## 2012-12-15 MED ORDER — POTASSIUM CHLORIDE ER 10 MEQ PO TBCR
10.0000 meq | EXTENDED_RELEASE_TABLET | Freq: Two times a day (BID) | ORAL | Status: DC
  Administered 2012-12-15 – 2012-12-16 (×2): 10 meq via ORAL
  Filled 2012-12-15 (×4): qty 1

## 2012-12-15 MED ORDER — SODIUM CHLORIDE 0.9 % IJ SOLN: 3.0000 mL | INTRAMUSCULAR | Status: DC | PRN

## 2012-12-15 MED ORDER — SODIUM CHLORIDE 0.9 % IV SOLN
INTRAVENOUS | Status: AC
  Administered 2012-12-15: 15:00:00 via INTRAVENOUS

## 2012-12-15 MED ORDER — TRAZODONE HCL 50 MG PO TABS
50.0000 mg | ORAL_TABLET | Freq: Every evening | ORAL | Status: DC | PRN
  Filled 2012-12-15: qty 1

## 2012-12-15 MED ORDER — LIDOCAINE HCL (PF) 1 % IJ SOLN
INTRAMUSCULAR | Status: AC
  Filled 2012-12-15: qty 30

## 2012-12-15 MED ORDER — LISINOPRIL 20 MG PO TABS
20.0000 mg | ORAL_TABLET | Freq: Every day | ORAL | Status: DC
  Administered 2012-12-16: 20 mg via ORAL
  Filled 2012-12-15 (×2): qty 1

## 2012-12-15 MED ORDER — CLOPIDOGREL BISULFATE 75 MG PO TABS: 75.0000 mg | ORAL_TABLET | Freq: Every day | ORAL | Status: DC

## 2012-12-15 MED ORDER — ONDANSETRON HCL 4 MG/2ML IJ SOLN: 4.0000 mg | Freq: Four times a day (QID) | INTRAMUSCULAR | Status: DC | PRN

## 2012-12-15 MED ORDER — HEPARIN (PORCINE) IN NACL 2-0.9 UNIT/ML-% IJ SOLN
INTRAMUSCULAR | Status: AC
  Filled 2012-12-15: qty 1500

## 2012-12-15 MED ORDER — ASPIRIN 81 MG PO CHEW
CHEWABLE_TABLET | ORAL | Status: AC
  Filled 2012-12-15: qty 4

## 2012-12-15 MED ORDER — CILOSTAZOL 100 MG PO TABS
100.0000 mg | ORAL_TABLET | Freq: Every day | ORAL | Status: DC
  Administered 2012-12-16: 100 mg via ORAL
  Filled 2012-12-15 (×2): qty 1

## 2012-12-15 MED ORDER — FUROSEMIDE 40 MG PO TABS
40.0000 mg | ORAL_TABLET | Freq: Every day | ORAL | Status: DC
  Administered 2012-12-16: 09:00:00 40 mg via ORAL
  Filled 2012-12-15 (×2): qty 1

## 2012-12-15 MED ORDER — INSULIN ASPART 100 UNIT/ML ~~LOC~~ SOLN
0.0000 [IU] | Freq: Every day | SUBCUTANEOUS | Status: DC
  Administered 2012-12-15: 5 [IU] via SUBCUTANEOUS

## 2012-12-15 MED ORDER — CLOPIDOGREL BISULFATE 75 MG PO TABS
75.0000 mg | ORAL_TABLET | Freq: Every day | ORAL | Status: DC
  Administered 2012-12-16: 75 mg via ORAL
  Filled 2012-12-15: qty 1

## 2012-12-15 MED ORDER — HEPARIN SODIUM (PORCINE) 1000 UNIT/ML IJ SOLN
INTRAMUSCULAR | Status: AC
  Filled 2012-12-15: qty 1

## 2012-12-15 NOTE — Progress Notes (Signed)
STACEY IN PV LAB NOTIFIED OF K+ RESULTS NOT BACK AND SHE WILL NOTIFY DR Allyson Sabal

## 2012-12-15 NOTE — Progress Notes (Signed)
Site area: left groin  Site Prior to Removal:  Level 0  Pressure Applied For 20 MINUTES    Minutes Beginning at 1640  Manual:   yes  Patient Status During Pull:  stable  Post Pull Groin Site:  Level 0  Post Pull Instructions Given:  yes  Post Pull Pulses Present:  yes  Dressing Applied:  yes  Comments:

## 2012-12-15 NOTE — H&P (Signed)
  H & P will be scanned in.  Pt was reexamined and existing H & P reviewed. No changes found.  Runell Gess, MD [facility] 12/15/2012 10:35 AM

## 2012-12-15 NOTE — CV Procedure (Signed)
Terri Alvarez is a 79 y.o. female    [id number] LOCATION:  FACILITY: [facility]  PHYSICIAN: Nanetta Batty, M.D. 1928/02/05   DATE OF PROCEDURE:  12/15/2012  DATE OF DISCHARGE:  [facility]  PV Intervention    History obtained from chart review.Terri Alvarez is an 79 year old widowed Caucasian female mother of 3, Temovate and 5 grandchildren who has a history of high blood pressure, hyperlipidemia, non-insulin-requiring diabetes and peripheral vascular occlusive disease. She had a Myoview performed in 2006 which was nonischemic. Angiogram for 01/03/2012 and it diamondback orbital rotational atherectomy of her right SFA both proximally as well as distally including the popliteal artery. Unfortunately she had distal embolization critical limb ischemia requiring open thrombectomy with restoration of flow. I saw her 6 months ago which is complaining of pain in her right lower extremity with some erythema of her toes and foot and right foot. Over the last 6 months this has gotten progressively worse. She now has a high-frequency signal in her distal left SFA and presents now for angiography and potential for acute intervention for critical limb ischemia.   PROCEDURE DESCRIPTION:    The patient was brought to the second floor [facility] cath lab in the postabsorptive state. She was premedicated with Valium 5 mg by mouth. Her left groin was prepped and shaved in usual sterile fashion. Xylocaine 1% was used for local anesthesia. A 5 French sheath was inserted into the left common femoral  artery using standard Seldinger technique. A 5 French pigtail catheter was used for abdominal aortography with bifemoral runoff using bolus chase digital subtraction step table technique. This opaqued allergies for the entirety of the case. Retrograde aortic pressure  Was monitored during the case. Following this contralateral access was obtained with a crossover catheter, VersaPort wire and  a 4 French endhole catheter was which was placed in the right external iliac artery to perform focal angiography of the distal SFA. A total of 169 cc was administered to the patient.   HEMODYNAMICS:    AO SYSTOLIC/AO DIASTOLIC: 178/52   ANGIOGRAPHIC RESULTS:   1: Abdominal aortogram-fluoroscopically calcified and narrowed without aneurysmal dilatation or significant obvious atherosclerosis  2: Left lower extremity: There were minor irregularities within the entire left SFA with one vessel runoff via the anterior tibial  3: Right lower extremity-there was a 60% focal proximal right SFA stenosis at the location of prior rotational atherectomy. The entire distal third of the SFA was 95% stenosed through the adductor canal and into the poplitea.l There was three-vessel runoff with an occluded posterior tibial.   IMPRESSION:Terri Alvarez has critical limb ischemia with a long segment subtotally occluded right SFA. We'll proceed with PTA using "chocolate balloon".  Procedure description: The patient received 4000 units of heparin intravenously with an ending ACT of 268. The 4 French endhole catheter was exchanged over a wire (035 Rosen wire) for a 6 [place] crossover sheath. Using an 014/300 cm length [place] for wire and an 014 cross endhole catheter was able to navigate the diffusely diseased SFA in place the wire in the below the knee popliteal artery. PTA was performed with a 2.5 mm x 120 mm long chocolate balloon for 3 minutes at 6 atmospheres followed by a 3.5/120 mm long chocolate balloon at 3 atmospheres for 3 minutes resulting reduction a long 95% segmental stenosis to less than 20% residual with excellent flow no dissection. The antrum was also performed of the below the knee tibial vessels revealed regularly widely patent  with an occluded posterior tibial as before.  Final impression: Successful chocolate balloon PTA of the long segment subtotally occluded distal right SFA for critical limb  ischemia. The patient tolerated the procedure well. She has a withdrawn across the bifurcation and secured. The patient left the Cath Lab in stable condition. The sheath will be removed once the ACT is document to be less than 170 pressure will be held on the groin to achieve hemostasis. The patient will be hydrated overnight, treated with aspirin Plavix and discharged on the morning. She'll get lower extremity Dopplers in our office at which time she will see me back for followup.  Runell Gess MD, [facility] 12/15/2012 11:58 AM

## 2012-12-16 LAB — BASIC METABOLIC PANEL
CO2: 27 mEq/L (ref 19–32)
Calcium: 8.7 mg/dL (ref 8.4–10.5)
Chloride: 108 mEq/L (ref 96–112)
Creatinine, Ser: 1.12 mg/dL — ABNORMAL HIGH (ref 0.50–1.10)
Glucose, Bld: 153 mg/dL — ABNORMAL HIGH (ref 70–99)
Sodium: 142 mEq/L (ref 135–145)

## 2012-12-16 LAB — CBC
HCT: 31.3 % — ABNORMAL LOW (ref 36.0–46.0)
MCH: 31.4 pg (ref 26.0–34.0)
MCV: 88.7 fL (ref 78.0–100.0)
RBC: 3.53 MIL/uL — ABNORMAL LOW (ref 3.87–5.11)
WBC: 6.8 10*3/uL (ref 4.0–10.5)

## 2012-12-16 MED ORDER — ASPIRIN EC 81 MG PO TBEC: 81.0000 mg | DELAYED_RELEASE_TABLET | Freq: Every day | ORAL | Status: AC

## 2012-12-16 NOTE — Discharge Summary (Signed)
Physician Discharge Summary  Patient ID: HARLEAN REGULA MRN: [id number] DOB/AGE: 77-Sep-1929 77 y.o.  Admit date: 12/15/2012 Discharge date: 12/16/2012  Admission Diagnoses: right critical limb ischemia  Discharge Diagnoses:  Principal Problem:   Critical lower limb ischemia, rt. foot. - S/P PTA of right SFA using cutting ballon with good angiographic result 12/15/12 Active Problems:   Abnormal ankle brachial index, rt. infrapopliteal disease, high grade Rt. SFA disease.   HTN (hypertension)   Hyperlipemia   DM (diabetes mellitus), type 2, non insulin depent.   Discharged Condition: stable  Hospital Course: The patient is an 79 year old Caucasian female with a history of HTN, HLD, non insulin-requiring DM and peripheral vascular occlusive disease. Her last Myoview was in 2006 and was nonischemic. In April 2013, she underwent a PV angiogram by Dr. Allyson Sabal. He performed [facility] orbital rotational atherectomy of her right SFA both proximally and distally as well as a popliteal artery. This resulted in distal embolization with critical limb ischemia requiring thrombectomy by Dr. Leonides Sake with restoration of flow. She had a 6 month follow-up with Dr. Allyson Sabal and noted progressively worsening right lower extremity pain with mild erythema of the toes. Doppler studies revealed a high frequency signal in her distal right SFA with an occluded posterior tibial. On 12/15/12, she presented to [facility] to undergo angiography and potential acute intervention for critical limb ischemia. The procedure was performed by Dr. Allyson Sabal. It revealed a fluroscopically calcified and narrowed abdominal aorta, without aneurysmal dilation or significant atherosclerosis. In the left lower extremity, there were minor irregularities within the entire left SFA with one vessel runoff via the anterior tibial. In the right lower extremity, there was a 60% focal proximal right SFA stenosis at the location of prior rotational atherectomy.   The entire distal third of the SFA was 95% stenosed through the adductor canal and into the popliteal.  There was three-vessel runoff with an occluded posterior tibial. It was decided to intervene on the right. She underwent successful PTA of the distal right SFA using a chocolate cutting balloon. She tolerated the procedure well. She left the cath lab in stable condition. She was kept overnight for observation and hydration. She had no post-operative complications. The following day, she was seen and examined by Dr. Allyson Sabal, who determined that she was stable for discharge home on ASA and Plavix. She will have follow-up doppler studies at [facility]. She will follow-up with Dr. Allyson Sabal on 12/28/2012.    Consults: None  Significant Diagnostic Studies:   PV Angio HEMODYNAMICS:  AO SYSTOLIC/AO DIASTOLIC: 178/52  ANGIOGRAPHIC RESULTS:  1: Abdominal aortogram-fluoroscopically calcified and narrowed without aneurysmal dilatation or significant obvious atherosclerosis  2: Left lower extremity: There were minor irregularities within the entire left SFA with one vessel runoff via the anterior tibial  3: Right lower extremity-there was a 60% focal proximal right SFA stenosis at the location of prior rotational atherectomy. The entire distal third of the SFA was 95% stenosed through the adductor canal and into the poplitea.l There was three-vessel runoff with an occluded posterior tibial.    Treatments: See Hospital Course  Discharge Exam: Blood pressure 151/48, pulse 78, temperature 97.9 F (36.6 C), temperature source Oral, resp. rate 18, height 4\' 10"  (1.473 m), weight 115 lb 4.8 oz (52.3 kg), SpO2 98.00%.   Disposition: 01-Home or Self Care      Discharge Orders   Future Orders Complete By Expires     Diet - low sodium heart healthy  As directed  Discharge instructions  As directed     Comments:      Resume Metformin on Monday 12/18/12    Driving Restrictions  As directed     Comments:      No  driving for 3 days    Increase activity slowly  As directed     Lifting restrictions  As directed     Comments:      No lifting more than 1/2 gallon of milk for 3 days        Medication List    STOP taking these medications       cilostazol 100 MG tablet  Commonly known as:  PLETAL      TAKE these medications       aspirin EC 81 MG tablet  Take 1 tablet (81 mg total) by mouth daily.     CALCIUM + D PO  Take 1 tablet by mouth daily.     carvedilol 6.25 MG tablet  Commonly known as:  COREG  Take 6.25 mg by mouth 2 (two) times daily with a meal.     clopidogrel 75 MG tablet  Commonly known as:  PLAVIX  Take 1 tablet (75 mg total) by mouth daily with breakfast.     furosemide 20 MG tablet  Commonly known as:  LASIX  Take 40 mg by mouth daily.     lisinopril 20 MG tablet  Commonly known as:  PRINIVIL,ZESTRIL  Take 20 mg by mouth daily.     metFORMIN 1000 MG tablet  Commonly known as:  GLUCOPHAGE  Take 1,000 mg by mouth 2 (two) times daily with a meal.     multivitamin with minerals Tabs  Take 1 tablet by mouth daily.     potassium chloride 10 MEQ tablet  Commonly known as:  K-DUR  Take 10 mEq by mouth 2 (two) times daily.     traZODone 50 MG tablet  Commonly known as:  DESYREL  Take 50 mg by mouth at bedtime as needed for sleep.       Follow-up Information   Follow up with Runell Gess, MD On 12/28/2012. (4:30 pm)    Contact information:   [address] [place] [address] [phone]      TIME SPENT ON DISCHARGE SUMMARY, INCLUDING PHYSICIAN TIME: > 30 MINUTES  Signed: Allayne Butcher, PA-C 12/17/2012, 12:25 PM

## 2012-12-16 NOTE — Progress Notes (Signed)
Subjective:  Looks and feels great. No foot pain for the first time.  Objective:  Temp:  [97.8 F (36.6 C)-98.4 F (36.9 C)] 97.8 F (36.6 C) (04/12 0430) Pulse Rate:  [62-95] 78 (04/12 0744) Resp:  [17-18] 18 (04/12 0744) BP: (114-159)/(25-85) 129/35 mmHg (04/12 0430) SpO2:  [94 %-100 %] 98 % (04/12 0744) Weight:  [51 kg (112 lb 7 oz)-52.3 kg (115 lb 4.8 oz)] 52.3 kg (115 lb 4.8 oz) (04/12 0430) Weight change:   Intake/Output from previous day: 04/11 0701 - 04/12 0700 In: 773.8 [P.O.:480; I.V.:293.8] Out: 725 [Urine:725]  Intake/Output from this shift:    Physical Exam: General appearance: alert and no distress Neck: no adenopathy, no carotid bruit, no JVD, supple, symmetrical, trachea midline and thyroid not enlarged, symmetric, no tenderness/mass/nodules Lungs: clear to auscultation bilaterally Heart: regular rate and rhythm, S1, S2 normal, no murmur, click, rub or gallop Extremities: Left groin OK. Right foot warm, 1+ R DPP.  Lab Results: Results for orders placed during the hospital encounter of 12/15/12 (from the past 48 hour(s))  GLUCOSE, CAPILLARY     Status: Abnormal   Collection Time    12/15/12  9:20 AM      Result Value Range   Glucose-Capillary 148 (*) 70 - 99 mg/dL  APTT     Status: Abnormal   Collection Time    12/15/12 10:00 AM      Result Value Range   aPTT 43 (*) 24 - 37 seconds   Comment:            IF BASELINE aPTT IS ELEVATED,     SUGGEST PATIENT RISK ASSESSMENT     BE USED TO DETERMINE APPROPRIATE     ANTICOAGULANT THERAPY.  PROTIME-INR     Status: None   Collection Time    12/15/12 10:00 AM      Result Value Range   Prothrombin Time 13.6  11.6 - 15.2 seconds   INR 1.05  0.00 - 1.49  POTASSIUM     Status: None   Collection Time    12/15/12 10:00 AM      Result Value Range   Potassium 3.9  3.5 - 5.1 mEq/L  POCT ACTIVATED CLOTTING TIME     Status: None   Collection Time    12/15/12 11:18 AM      Result Value Range   Activated Clotting  Time 301    POCT ACTIVATED CLOTTING TIME     Status: None   Collection Time    12/15/12 11:44 AM      Result Value Range   Activated Clotting Time 268    GLUCOSE, CAPILLARY     Status: None   Collection Time    12/15/12 12:13 PM      Result Value Range   Glucose-Capillary 95  70 - 99 mg/dL  POCT ACTIVATED CLOTTING TIME     Status: None   Collection Time    12/15/12  2:40 PM      Result Value Range   Activated Clotting Time 181    POCT ACTIVATED CLOTTING TIME     Status: None   Collection Time    12/15/12  4:19 PM      Result Value Range   Activated Clotting Time 160    GLUCOSE, CAPILLARY     Status: Abnormal   Collection Time    12/15/12  7:20 PM      Result Value Range   Glucose-Capillary 161 (*) 70 - 99 mg/dL  Comment 1 Documented in Chart     Comment 2 Notify RN    GLUCOSE, CAPILLARY     Status: Abnormal   Collection Time    12/15/12  9:43 PM      Result Value Range   Glucose-Capillary 423 (*) 70 - 99 mg/dL   Comment 1 Documented in Chart     Comment 2 Notify RN    GLUCOSE, RANDOM     Status: Abnormal   Collection Time    12/15/12 10:18 PM      Result Value Range   Glucose, Bld 462 (*) 70 - 99 mg/dL  GLUCOSE, CAPILLARY     Status: Abnormal   Collection Time    12/15/12 11:49 PM      Result Value Range   Glucose-Capillary 359 (*) 70 - 99 mg/dL   Comment 1 Documented in Chart     Comment 2 Notify RN    BASIC METABOLIC PANEL     Status: Abnormal   Collection Time    12/16/12  5:45 AM      Result Value Range   Sodium 142  135 - 145 mEq/L   Potassium 3.8  3.5 - 5.1 mEq/L   Chloride 108  96 - 112 mEq/L   CO2 27  19 - 32 mEq/L   Glucose, Bld 153 (*) 70 - 99 mg/dL   BUN 37 (*) 6 - 23 mg/dL   Creatinine, Ser 0.86 (*) 0.50 - 1.10 mg/dL   Calcium 8.7  8.4 - 57.8 mg/dL   GFR calc non Af Amer 44 (*) >90 mL/min   GFR calc Af Amer 52 (*) >90 mL/min   Comment:            The eGFR has been calculated     using the CKD EPI equation.     This calculation has not been      validated in all clinical     situations.     eGFR's persistently     <90 mL/min signify     possible Chronic Kidney Disease.  CBC     Status: Abnormal   Collection Time    12/16/12  5:45 AM      Result Value Range   WBC 6.8  4.0 - 10.5 K/uL   RBC 3.53 (*) 3.87 - 5.11 MIL/uL   Hemoglobin 11.1 (*) 12.0 - 15.0 g/dL   HCT 46.9 (*) 62.9 - 52.8 %   MCV 88.7  78.0 - 100.0 fL   MCH 31.4  26.0 - 34.0 pg   MCHC 35.5  30.0 - 36.0 g/dL   RDW 41.3  24.4 - 01.0 %   Platelets 155  150 - 400 K/uL    Imaging: Imaging results have been reviewed  Assessment/Plan:   1. Active Problems: 2.   * No active hospital problems. * 3.   Time Spent Directly with Patient:  20 minutes  Length of Stay:  LOS: 1 day   S/P Chocolate balloon PTA sub totally occluded RSFA for CLI. Good angiographic and clinical result. Labs OK. D/C home this AM on ASA and Plavix. LEA then R OV with me.   Runell Gess 12/16/2012, 7:47 AM

## 2012-12-16 NOTE — Plan of Care (Signed)
Problem: Consults Goal: Vascular Cath Int Patient Education (See Patient Education module for education specifics.) Outcome: Completed/Met Date Met:  12/16/12 Reviewed vascular cath and access information with patient. Questions answered. Goal: Diabetes Guidelines if Diabetic/Glucose > 140 If diabetic or lab glucose is > 140 mg/dl - Initiate Diabetes/Hyperglycemia Guidelines & Document Interventions  Outcome: Completed/Met Date Met:  12/16/12 Reviewed information on holding metformin to decrease risk of kidney damage and need for sliding scale insulin to cover increased blood sugar while in the hospital. Patient states her blood sugars are well controlled at home on metformin. Verbalizes good understanding of diet and exercise and how they effect blood sugar. Good verbalized understanding after information reviewed and patient agreed to have the sliding scale coverage at this time.  Problem: Phase I Progression Outcomes Goal: Vascular site scale level 0 - I Vascular Site Scale Level 0: No bruising/bleeding/hematoma Level I (Mild): Bruising/Ecchymosis, minimal bleeding/ooozing, palpable hematoma < 3 cm Level II (Moderate): Bleeding not affecting hemodynamic parameters, pseudoaneurysm, palpable hematoma > 3 cm Outcome: Completed/Met Date Met:  12/16/12 Gauze pressure dressing dry and intact. Groin site soft and non tender. No bruising noted. Goal: Pain controlled with appropriate interventions Outcome: Completed/Met Date Met:  12/16/12 Denies any pain tonight Goal: Initial discharge plan identified Outcome: Completed/Met Date Met:  12/16/12 Discharge plan to return home with services and supportive community.  Goal: Voiding-avoid urinary catheter unless indicated Outcome: Completed/Met Date Met:  12/16/12 Voiding in good amounts, up to bathroom post bedrest. Goal: Hemodynamically stable Outcome: Completed/Met Date Met:  12/16/12 Blood pressure stable and remains in sinus rhythm.

## 2012-12-29 ENCOUNTER — Ambulatory Visit (HOSPITAL_COMMUNITY)
Admission: RE | Admit: 2012-12-29 | Discharge: 2012-12-29 | Disposition: A | Discharge status: Home / Self Care | Payer: Medicare Other | Source: Ambulatory Visit | Attending: Cardiovascular Disease | Admitting: Cardiovascular Disease

## 2012-12-29 DIAGNOSIS — I998 Other disorder of circulatory system: Secondary | ICD-10-CM | POA: Insufficient documentation

## 2012-12-29 NOTE — Progress Notes (Signed)
Arterial Duplex Right Lower Ext. Completed. Terri Alvarez

## 2013-01-08 ENCOUNTER — Encounter: Payer: Self-pay | Admitting: Cardiovascular Disease

## 2013-04-07 IMAGING — RF DG TIBIA/FIBULA 2V*R*
1 series · 4 of 4 positions shown · non-contrast
Comparison: None available

CLINICAL DATA: History of critical limb ischemia, status post
peripheral intervention resulting in acute limb ischemia due to
tibial embolization

C-ARM 1-60 MIN,RIGHT TIBIA AND FIBULA - 2 VIEW

[Series 1: run · 4 of 4 slices shown]
[im 1/4]
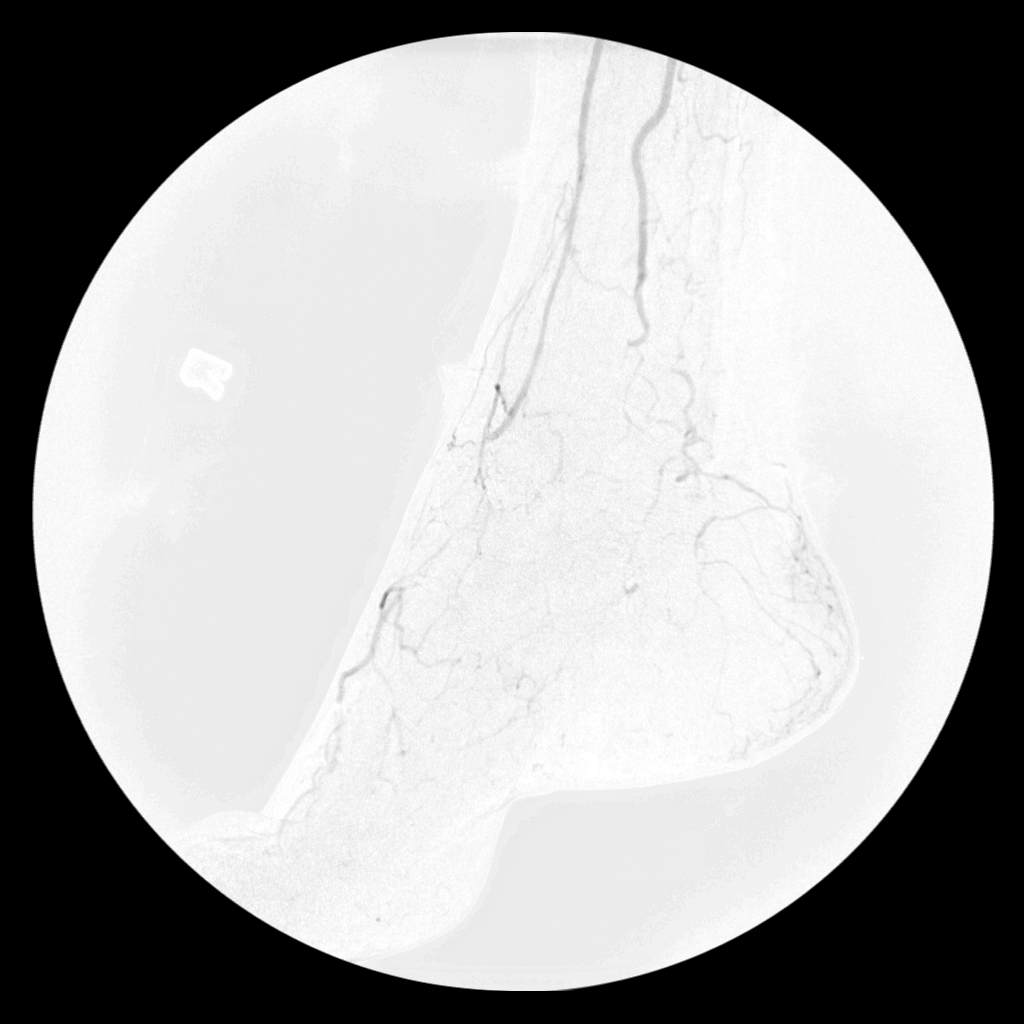
[im 2/4]
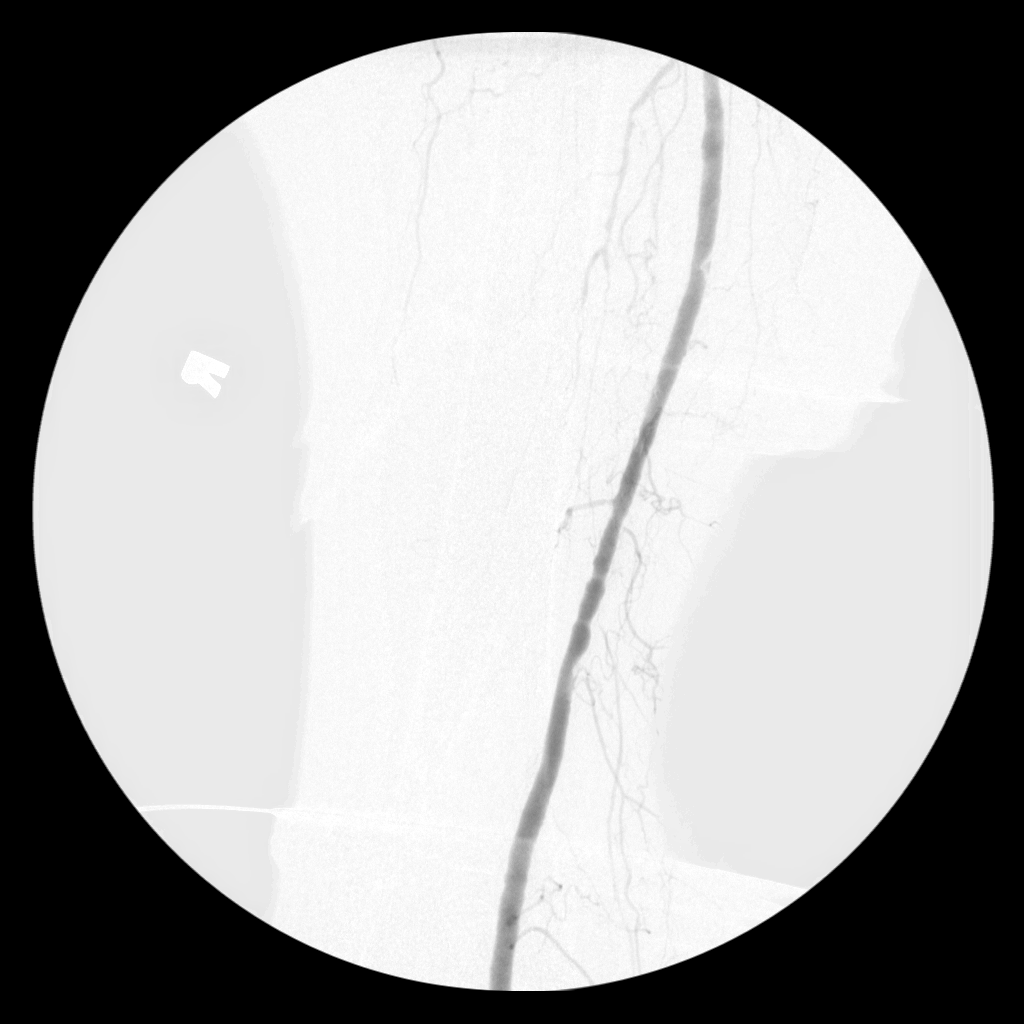
[im 3/4]
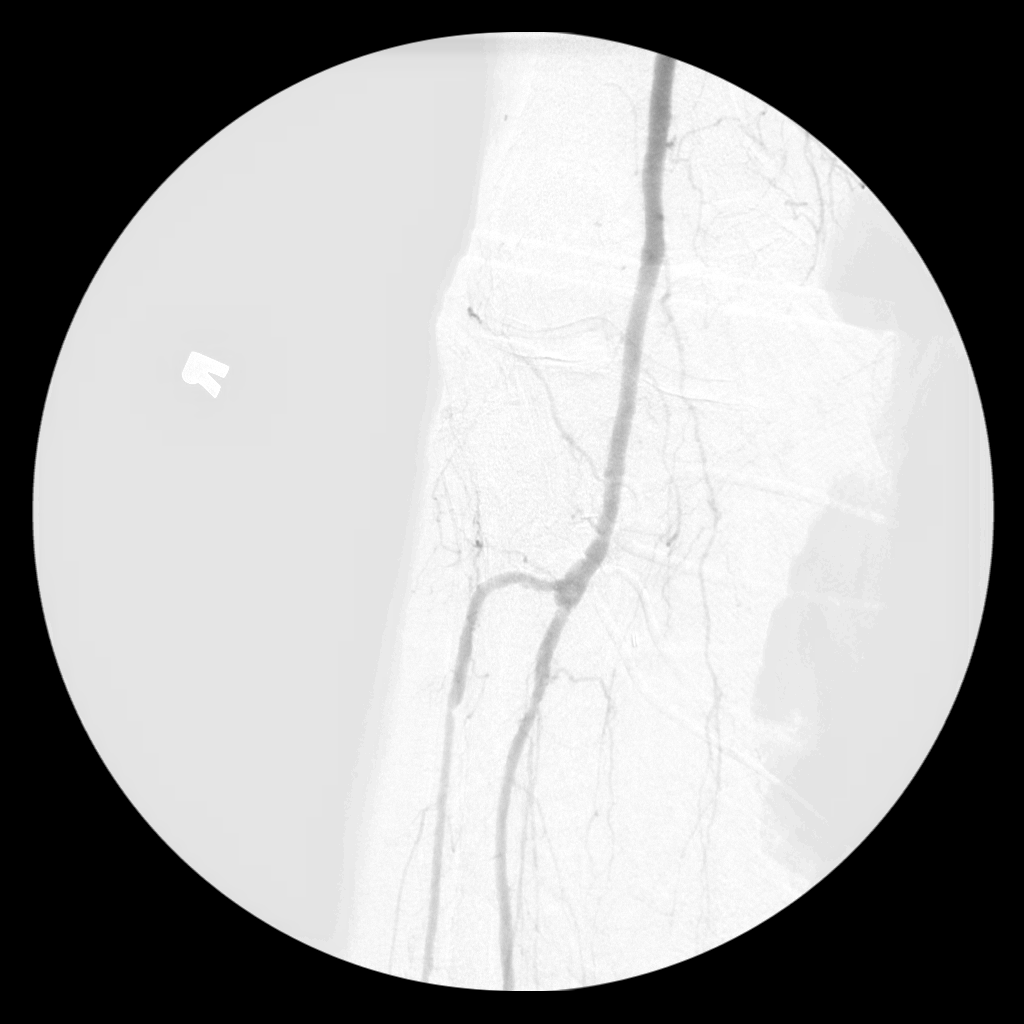
[im 4/4]
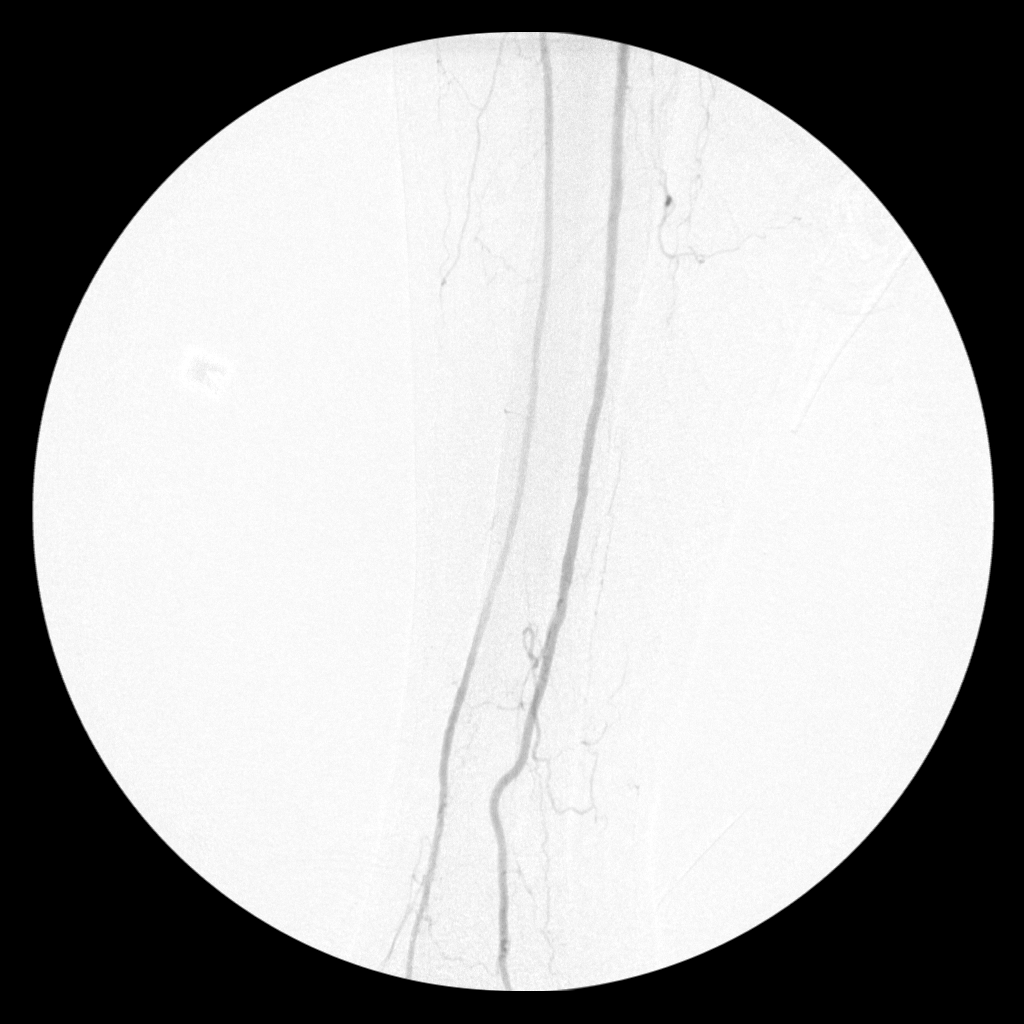

[4 of 4 positions shown; findings below may reference images not displayed]

FINDINGS: Limited intraoperative subtraction right lower extremity
angiogram.  This demonstrates patency of the right distal SFA with
mild luminal irregularities related to plaque formation.  Popliteal
artery across the knee is patent.  The anterior tibial and peroneal
arteries are diseased but patent in the right lower extremity.
Anterior tibial artery and peroneal artery both terminate above the
ankle.  Small collaterals cross the ankle into the foot.
IMPRESSION: Patent right distal SFA and popliteal artery with two-vessel right
lower extremity runoff via anterior tibial and peroneal arteries
terminating above the foot at the ankle level.  Small collaterals
cross the ankle into the foot.

## 2013-05-21 ENCOUNTER — Telehealth (HOSPITAL_COMMUNITY): Payer: Self-pay | Admitting: *Deleted

## 2013-06-07 ENCOUNTER — Telehealth (HOSPITAL_COMMUNITY): Payer: Self-pay | Admitting: *Deleted

## 2013-06-29 NOTE — Telephone Encounter (Signed)
  Dear Physician  Our office has attempted to contact your patient Terri Alvarez  twice by telephone and we have also sent an appointment letter to schedule the LEA  test you ordered. The patient has not responded. We will not make any further attempts to contact the patient. If any further assistance is needed for this referral, please contact our office at [phone]  Sincerely, [facility] Cardiovascular Imaging Scheduling Team

## 2013-07-02 NOTE — Telephone Encounter (Signed)
noted 

## 2013-07-25 ENCOUNTER — Encounter (INDEPENDENT_AMBULATORY_CARE_PROVIDER_SITE_OTHER): Payer: Self-pay

## 2013-07-25 ENCOUNTER — Ambulatory Visit (INDEPENDENT_AMBULATORY_CARE_PROVIDER_SITE_OTHER): Payer: PRIVATE HEALTH INSURANCE

## 2013-07-25 VITALS — BP 159/70 | HR 90 | Resp 18

## 2013-07-25 DIAGNOSIS — B351 Tinea unguium: Secondary | ICD-10-CM

## 2013-07-25 DIAGNOSIS — I739 Peripheral vascular disease, unspecified: Secondary | ICD-10-CM

## 2013-07-25 DIAGNOSIS — M79609 Pain in unspecified limb: Secondary | ICD-10-CM

## 2013-07-25 DIAGNOSIS — I999 Unspecified disorder of circulatory system: Secondary | ICD-10-CM

## 2013-07-25 DIAGNOSIS — I998 Other disorder of circulatory system: Secondary | ICD-10-CM

## 2013-07-25 NOTE — Progress Notes (Signed)
  Subjective:    Patient ID: Terri Alvarez, female    DOB: Mar 17, 1928, 79 y.o.   MRN: [id number]  HPI trim my nails and the big toenails are long and thick Patient does have history of previous Posterior calcaneal ulcer/heel ulcer which is resolved and stable at the current time. Patient continues to have trophic skin changes and cool temperature both feet walks with the assistance of a walker.   Review of Systems  Constitutional: Negative.   HENT: Negative.   Eyes: Negative.   Respiratory: Negative.   Cardiovascular: Negative.   Gastrointestinal: Negative.   Endocrine: Negative.   Musculoskeletal: Negative.   Skin: Negative.   Allergic/Immunologic: Negative.   Neurological: Negative.   Hematological: Bruises/bleeds easily.  Psychiatric/Behavioral: Negative.        Objective:   Physical Exam Vascular status as follows pulses dorsalis pedis thready one over 4 bilateral PT nonpalpable bilateral Refill time 3 seconds to 4 seconds all digits. Skin temperature warm to cool turgor diminished no edema rubor or varicosities noted. Neurologically epicritic and proprioceptive sensations intact although diminished on Semmes Weinstein testing to forefoot and digits bilateral. Dermatologically nails thick brittle incurvated and friable 1 through 5 bilateral most painful tender symptomatic of the hallux and second digits bilateral. Of the third through fifth digits also tender on palpation and with enclosed shoe wear. Orthopedic biomechanical exam otherwise unremarkable contracture fever fat pad. Atrophy of musculature right more so than left with abnormality gait being noted. Patient does have history of PVD with arterial sclerosis and ischemic changes of lower extremity history of ulceration although currently stable.     Assessment & Plan:  History of diabetes with neuropathy. Significant history of lower extremity vascular disease atherosclerosis and ischemic changes. Patient does have thick  brittle friable mycotic nails with discoloration and incurvation and tenderness. Painful nails 1 through 4 bilateral debridement at this time return for continued palliative care every 3 months. Left hallux is treated with lumicain and Neosporin following debridement. Contact us with any changes or exacerbations occur or any wounds were to develop.  Alvan Dame DPM

## 2013-07-25 NOTE — Patient Instructions (Signed)

## 2013-10-15 ENCOUNTER — Other Ambulatory Visit (HOSPITAL_COMMUNITY): Payer: Self-pay | Admitting: Cardiovascular Disease

## 2013-10-15 DIAGNOSIS — I6529 Occlusion and stenosis of unspecified carotid artery: Secondary | ICD-10-CM

## 2013-10-15 DIAGNOSIS — I739 Peripheral vascular disease, unspecified: Secondary | ICD-10-CM

## 2013-10-23 ENCOUNTER — Encounter (HOSPITAL_COMMUNITY): Payer: Medicare Other

## 2013-10-24 ENCOUNTER — Ambulatory Visit: Payer: PRIVATE HEALTH INSURANCE

## 2013-11-06 ENCOUNTER — Telehealth: Payer: Self-pay | Admitting: Cardiovascular Disease

## 2013-11-06 ENCOUNTER — Ambulatory Visit (HOSPITAL_COMMUNITY)
Admission: RE | Admit: 2013-11-06 | Discharge: 2013-11-06 | Disposition: A | Discharge status: Home / Self Care | Payer: Medicare Other | Source: Ambulatory Visit | Attending: Cardiovascular Disease | Admitting: Cardiovascular Disease

## 2013-11-06 ENCOUNTER — Ambulatory Visit (INDEPENDENT_AMBULATORY_CARE_PROVIDER_SITE_OTHER): Payer: Medicare Other | Admitting: Cardiovascular Disease

## 2013-11-06 ENCOUNTER — Ambulatory Visit (HOSPITAL_BASED_OUTPATIENT_CLINIC_OR_DEPARTMENT_OTHER)
Admission: RE | Admit: 2013-11-06 | Discharge: 2013-11-06 | Disposition: A | Discharge status: Home / Self Care | Payer: Medicare Other | Source: Ambulatory Visit | Attending: Cardiovascular Disease | Admitting: Cardiovascular Disease

## 2013-11-06 ENCOUNTER — Encounter: Payer: Self-pay | Admitting: Cardiovascular Disease

## 2013-11-06 VITALS — BP 152/60 | HR 91 | Ht 61.0 in | Wt 126.0 lb

## 2013-11-06 DIAGNOSIS — I999 Unspecified disorder of circulatory system: Secondary | ICD-10-CM

## 2013-11-06 DIAGNOSIS — I70209 Unspecified atherosclerosis of native arteries of extremities, unspecified extremity: Secondary | ICD-10-CM

## 2013-11-06 DIAGNOSIS — I70219 Atherosclerosis of native arteries of extremities with intermittent claudication, unspecified extremity: Secondary | ICD-10-CM

## 2013-11-06 DIAGNOSIS — I70229 Atherosclerosis of native arteries of extremities with rest pain, unspecified extremity: Secondary | ICD-10-CM

## 2013-11-06 DIAGNOSIS — I6529 Occlusion and stenosis of unspecified carotid artery: Secondary | ICD-10-CM | POA: Insufficient documentation

## 2013-11-06 DIAGNOSIS — I998 Other disorder of circulatory system: Secondary | ICD-10-CM

## 2013-11-06 DIAGNOSIS — I10 Essential (primary) hypertension: Secondary | ICD-10-CM

## 2013-11-06 DIAGNOSIS — I739 Peripheral vascular disease, unspecified: Secondary | ICD-10-CM

## 2013-11-06 DIAGNOSIS — Z9862 Peripheral vascular angioplasty status: Secondary | ICD-10-CM

## 2013-11-06 DIAGNOSIS — Z79899 Other long term (current) drug therapy: Secondary | ICD-10-CM

## 2013-11-06 DIAGNOSIS — E782 Mixed hyperlipidemia: Secondary | ICD-10-CM

## 2013-11-06 DIAGNOSIS — E785 Hyperlipidemia, unspecified: Secondary | ICD-10-CM

## 2013-11-06 MED ORDER — FUROSEMIDE 20 MG PO TABS: 40.0000 mg | ORAL_TABLET | Freq: Every day | ORAL | Status: DC

## 2013-11-06 MED ORDER — CARVEDILOL 6.25 MG PO TABS: 6.2500 mg | ORAL_TABLET | Freq: Two times a day (BID) | ORAL | Status: DC

## 2013-11-06 MED ORDER — CLOPIDOGREL BISULFATE 75 MG PO TABS: 75.0000 mg | ORAL_TABLET | Freq: Every day | ORAL | Status: DC

## 2013-11-06 MED ORDER — CILOSTAZOL 100 MG PO TABS: 100.0000 mg | ORAL_TABLET | Freq: Every day | ORAL | Status: DC

## 2013-11-06 NOTE — Assessment & Plan Note (Signed)
Controlled on current medications 

## 2013-11-06 NOTE — Progress Notes (Signed)
Arterial Duplex Lower Ext. Completed. Leita Lindbloom, BS, RDMS, RVT  

## 2013-11-06 NOTE — Telephone Encounter (Signed)
Spoke w/ pt and reviewed lab results from March/April 2014.  Pt would also like to see Dr. Allyson SabalBerry while here.  Informed opening at 11:45am and confirmed w/ Samara DeistKathryn, RN (Dr. Allyson SabalBerry).  Pt with difficulty walking and will stop by Registration after appt.

## 2013-11-06 NOTE — Progress Notes (Signed)
11/06/2013 Terri SaxDorcas B Alvarez   29-Jul-1928  [id number]  Primary Physician Modesto CharonKaur, [place] Primary Cardiologist: Runell GessJonathan J. Lamoyne Hessel MD Roseanne RenoFACP,FACC,FAHA, FSCAI   HPI:  Terri Alvarez is an 80 year old widowed Caucasian female mother of 3, Temovate and 5 grandchildren who has a history of high blood pressure, hyperlipidemia, non-insulin-requiring diabetes and peripheral vascular occlusive disease. She had a Myoview performed in 2006 which was nonischemic. Angiogram for 01/03/2012 and it diamondback orbital rotational atherectomy of her right SFA both proximally as well as distally including the popliteal artery. Unfortunately she had distal embolization critical limb ischemia requiring open thrombectomy with restoration of flow. I saw her 6 months ago which is complaining of pain in her right lower extremity with some erythema of her toes and foot and right foot. Over the last 6 months this has gotten progressively worse. She had a high-frequency signal in her distal left SFA and underwent angiography and percutaneous intervention of her right SFA in April of last year with an excellent angiographic result. Recent Dopplers performed 11/06/13 revealed a right ABI 0.79 the left of 0.86. Her stents appear patent. She really denies claudication, chest pain or shortness of breath. Carotid Dopplers performed today as well revealed unchanged bilateral internal carotid artery stenosis in the moderate range.  Current Outpatient Prescriptions  Medication Sig Dispense Refill  . aspirin EC 81 MG tablet Take 1 tablet (81 mg total) by mouth daily.      . Calcium Carbonate-Vitamin D (CALCIUM + D PO) Take 1 tablet by mouth daily.      . carvedilol (COREG) 6.25 MG tablet Take 6.25 mg by mouth 2 (two) times daily with a meal.      . furosemide (LASIX) 20 MG tablet Take 40 mg by mouth daily.       Marland Kitchen. lisinopril (PRINIVIL,ZESTRIL) 20 MG tablet Take 20 mg by mouth daily.      . metFORMIN (GLUCOPHAGE) 1000 MG tablet Take 1,000 mg by  mouth 2 (two) times daily with a meal.      . Multiple Vitamin (MULITIVITAMIN WITH MINERALS) TABS Take 1 tablet by mouth daily.      . potassium chloride (K-DUR) 10 MEQ tablet Take 10 mEq by mouth 2 (two) times daily.      . cilostazol (PLETAL) 100 MG tablet Take 100 mg by mouth daily.      . clopidogrel (PLAVIX) 75 MG tablet Take 75 mg by mouth daily.      . TRAVATAN Z 0.004 % SOLN ophthalmic solution Place 1 drop into both eyes daily.      . traZODone (DESYREL) 50 MG tablet Take 50 mg by mouth at bedtime as needed for sleep.       No current facility-administered medications for this visit.    No Known Allergies  History   Social History  . Marital Status: Divorced    Spouse Name: N/A    Number of Children: N/A  . Years of Education: N/A   Occupational History  . Not on file.   Social History Main Topics  . Smoking status: Never Smoker   . Smokeless tobacco: Never Used  . Alcohol Use: No  . Drug Use: No  . Sexual Activity: No   Other Topics Concern  . Not on file   Social History Narrative  . No narrative on file     Review of Systems: General: negative for chills, fever, night sweats or weight changes.  Cardiovascular: negative for chest pain, dyspnea on exertion, edema,  orthopnea, palpitations, paroxysmal nocturnal dyspnea or shortness of breath Dermatological: negative for rash Respiratory: negative for cough or wheezing Urologic: negative for hematuria Abdominal: negative for nausea, vomiting, diarrhea, bright red blood per rectum, melena, or hematemesis Neurologic: negative for visual changes, syncope, or dizziness All other systems reviewed and are otherwise negative except as noted above.    Blood pressure 152/60, pulse 91, height 5\' 1"  (1.549 m), weight 57.153 kg (126 lb).  General appearance: alert and no distress Neck: no adenopathy, no JVD, supple, symmetrical, trachea midline, thyroid not enlarged, symmetric, no tenderness/mass/nodules and soft  bilateral carotid bruits Lungs: clear to auscultation bilaterally Heart: regular rate and rhythm, S1, S2 normal, no murmur, click, rub or gallop Extremities: extremities normal, atraumatic, no cyanosis or edema and eerythema distal half of the dorsum of her right foot and her toes unchanged from prior exam  EKG sinus rhythm 91 without ST or T wave changes  ASSESSMENT AND PLAN:   Critical lower limb ischemia, rt. foot. - S/P PTA of right SFA using cutting ballon with good angiographic result 12/15/12 I performed percutaneous intervention on her right lower extremity 01/03/12 and 40/11/14 with excellent angiographic result. The most recent procedure was using a chocolate balloon. She is two-vessel runoff on the right with occluded posterior tibial and 1 vessel runoff on the left with a patent anterior tibial. She denies claudication. She does have some erythema on her right foot distal aspect of the dorsum involving her toes which are unchanged.  HTN (hypertension) Controlled on current medications  Hyperlipemia Not on statin therapy. We'll recheck a lipid and liver profile      Runell Gess MD [facility], [facility] 11/06/2013 12:43 PM

## 2013-11-06 NOTE — Patient Instructions (Signed)
  We will see you back in follow up in 6 months with Dr Allyson SabalBerry.  Dr Allyson SabalBerry has ordered for you to have some blood work done, fasting.

## 2013-11-06 NOTE — Assessment & Plan Note (Signed)
Not on statin therapy. We'll recheck a lipid and liver profile

## 2013-11-06 NOTE — Telephone Encounter (Signed)
Patient is here today for a some tests. However, she indicated that she never received any results of her previous test. She would like to speak with a nurse or Dr Allyson SabalBerry today. Thanks.

## 2013-11-06 NOTE — Assessment & Plan Note (Signed)
I performed percutaneous intervention on her right lower extremity 01/03/12 and 40/11/14 with excellent angiographic result. The most recent procedure was using a chocolate balloon. She is two-vessel runoff on the right with occluded posterior tibial and 1 vessel runoff on the left with a patent anterior tibial. She denies claudication. She does have some erythema on her right foot distal aspect of the dorsum involving her toes which are unchanged.

## 2013-11-06 NOTE — Progress Notes (Signed)
Carotid Duplex Completed. Saagar Tortorella, BS, RDMS, RVT  

## 2013-11-11 ENCOUNTER — Telehealth: Payer: Self-pay | Admitting: *Deleted

## 2013-11-11 DIAGNOSIS — I739 Peripheral vascular disease, unspecified: Secondary | ICD-10-CM

## 2013-11-11 NOTE — Telephone Encounter (Signed)
Order placed for repeat carotid dopplers in 1 year Order placed for repeat lower extremity arterial doppler in 6 months

## 2013-11-11 NOTE — Telephone Encounter (Signed)
Message copied by Marella BileVOGEL, Jorden Mahl W. on Sun Nov 11, 2013 10:24 PM ------      Message from: Runell GessBERRY, JONATHAN J      Created: Sun Nov 11, 2013  5:48 PM       No change from prior study. Repeat in 12 months. ------

## 2013-11-15 ENCOUNTER — Ambulatory Visit: Payer: PRIVATE HEALTH INSURANCE

## 2013-11-22 ENCOUNTER — Ambulatory Visit: Payer: PRIVATE HEALTH INSURANCE

## 2013-12-06 ENCOUNTER — Ambulatory Visit: Payer: PRIVATE HEALTH INSURANCE

## 2014-01-03 ENCOUNTER — Ambulatory Visit (INDEPENDENT_AMBULATORY_CARE_PROVIDER_SITE_OTHER): Payer: PRIVATE HEALTH INSURANCE

## 2014-01-03 VITALS — BP 150/72 | HR 80 | Resp 12

## 2014-01-03 DIAGNOSIS — E1149 Type 2 diabetes mellitus with other diabetic neurological complication: Secondary | ICD-10-CM

## 2014-01-03 DIAGNOSIS — B351 Tinea unguium: Secondary | ICD-10-CM

## 2014-01-03 DIAGNOSIS — M79609 Pain in unspecified limb: Secondary | ICD-10-CM

## 2014-01-03 DIAGNOSIS — E1140 Type 2 diabetes mellitus with diabetic neuropathy, unspecified: Secondary | ICD-10-CM

## 2014-01-03 DIAGNOSIS — I739 Peripheral vascular disease, unspecified: Secondary | ICD-10-CM

## 2014-01-03 NOTE — Progress Notes (Signed)
   Subjective:    Patient ID: Terri Alvarez, female    DOB: 1928/03/21, 80 y.o.   MRN: [id number]  HPI  TOENAILS TRIM.    Review of Systems no new changes or findings      Objective:   Physical Exam 80 year old white female well-developed well-nourished. History presents at this time for diabetic foot and nail care patient does have leg discrepancy with gait abnormality lotion objective findings as follows vascular status reveals thready dorsalis pedis pulse one over 4 bilateral nonpalpable PT pulse bilateral absent hair growth diminished skin texture and turgor there are skin changes in the feet in nails with absent hair growth. Nails thick brittle crumbly friable discolored 1 through 5 bilateral. On Semmes Weinstein testing there is decreased epicritic sensation to the distal digits plantar foot and arch and heel. Orthopedic biomechanical exam reveals rigid digital contractures again limb length discrepancy short right leg does wear a coming shoes are shoes and each foot. Is a buildup on her right foot. At this time no other wounds no open ulcers noted discolorations mild digital contractures are noted to       Assessment & Plan:  Assessment this time diabetes with neuropathy thickened dystrophic friable brittle crumbly mycotic nails 1 through 5 bilateral are debrided and the presence of pain in symptomology as well as diabetes and complications and vascular compromise. Return for future palliative care and as-needed basis maintain appropriate socks and accommodative shoes at all times next progress Alvan Dameichard Eliott Amparan DPM

## 2014-01-03 NOTE — Patient Instructions (Signed)
Diabetes and Foot Care Diabetes may cause you to have problems because of poor blood supply (circulation) to your feet and legs. This may cause the skin on your feet to become thinner, break easier, and heal more slowly. Your skin may become dry, and the skin may peel and crack. You may also have nerve damage in your legs and feet causing decreased feeling in them. You may not notice minor injuries to your feet that could lead to infections or more serious problems. Taking care of your feet is one of the most important things you can do for yourself.  HOME CARE INSTRUCTIONS  Wear shoes at all times, even in the house. Do not go barefoot. Bare feet are easily injured.  Check your feet daily for blisters, cuts, and redness. If you cannot see the bottom of your feet, use a mirror or ask someone for help.  Wash your feet with warm water (do not use hot water) and mild soap. Then pat your feet and the areas between your toes until they are completely dry. Do not soak your feet as this can dry your skin.  Apply a moisturizing lotion or petroleum jelly (that does not contain alcohol and is unscented) to the skin on your feet and to dry, brittle toenails. Do not apply lotion between your toes.  Trim your toenails straight across. Do not dig under them or around the cuticle. File the edges of your nails with an emery board or nail file.  Do not cut corns or calluses or try to remove them with medicine.  Wear clean socks or stockings every day. Make sure they are not too tight. Do not wear knee-high stockings since they may decrease blood flow to your legs.  Wear shoes that fit properly and have enough cushioning. To break in new shoes, wear them for just a few hours a day. This prevents you from injuring your feet. Always look in your shoes before you put them on to be sure there are no objects inside.  Do not cross your legs. This may decrease the blood flow to your feet.  If you find a minor scrape,  cut, or break in the skin on your feet, keep it and the skin around it clean and dry. These areas may be cleansed with mild soap and water. Do not cleanse the area with peroxide, alcohol, or iodine.  When you remove an adhesive bandage, be sure not to damage the skin around it.  If you have a wound, look at it several times a day to make sure it is healing.  Do not use heating pads or hot water bottles. They may burn your skin. If you have lost feeling in your feet or legs, you may not know it is happening until it is too late.  Make sure your health care provider performs a complete foot exam at least annually or more often if you have foot problems. Report any cuts, sores, or bruises to your health care provider immediately. SEEK MEDICAL CARE IF:   You have an injury that is not healing.  You have cuts or breaks in the skin.  You have an ingrown nail.  You notice redness on your legs or feet.  You feel burning or tingling in your legs or feet.  You have pain or cramps in your legs and feet.  Your legs or feet are numb.  Your feet always feel cold. SEEK IMMEDIATE MEDICAL CARE IF:   There is increasing redness,   swelling, or pain in or around a wound.  There is a red line that goes up your leg.  Pus is coming from a wound.  You develop a fever or as directed by your health care provider.  You notice a bad smell coming from an ulcer or wound. Document Released: 08/20/2000 Document Revised: 04/25/2013 Document Reviewed: 01/30/2013 Exit[employer] Patient Information 2014 [employer].  

## 2014-04-04 ENCOUNTER — Ambulatory Visit: Payer: PRIVATE HEALTH INSURANCE

## 2014-04-11 ENCOUNTER — Ambulatory Visit (INDEPENDENT_AMBULATORY_CARE_PROVIDER_SITE_OTHER): Payer: PRIVATE HEALTH INSURANCE

## 2014-04-11 VITALS — BP 123/63 | HR 78 | Resp 18

## 2014-04-11 DIAGNOSIS — M79609 Pain in unspecified limb: Secondary | ICD-10-CM

## 2014-04-11 DIAGNOSIS — B351 Tinea unguium: Secondary | ICD-10-CM

## 2014-04-11 DIAGNOSIS — E1149 Type 2 diabetes mellitus with other diabetic neurological complication: Secondary | ICD-10-CM

## 2014-04-11 DIAGNOSIS — M79606 Pain in leg, unspecified: Secondary | ICD-10-CM

## 2014-04-11 DIAGNOSIS — E1140 Type 2 diabetes mellitus with diabetic neuropathy, unspecified: Secondary | ICD-10-CM

## 2014-04-11 DIAGNOSIS — I739 Peripheral vascular disease, unspecified: Secondary | ICD-10-CM

## 2014-04-11 NOTE — Patient Instructions (Signed)
Diabetes and Foot Care Diabetes may cause you to have problems because of poor blood supply (circulation) to your feet and legs. This may cause the skin on your feet to become thinner, break easier, and heal more slowly. Your skin may become dry, and the skin may peel and crack. You may also have nerve damage in your legs and feet causing decreased feeling in them. You may not notice minor injuries to your feet that could lead to infections or more serious problems. Taking care of your feet is one of the most important things you can do for yourself.  HOME CARE INSTRUCTIONS  Wear shoes at all times, even in the house. Do not go barefoot. Bare feet are easily injured.  Check your feet daily for blisters, cuts, and redness. If you cannot see the bottom of your feet, use a mirror or ask someone for help.  Wash your feet with warm water (do not use hot water) and mild soap. Then pat your feet and the areas between your toes until they are completely dry. Do not soak your feet as this can dry your skin.  Apply a moisturizing lotion or petroleum jelly (that does not contain alcohol and is unscented) to the skin on your feet and to dry, brittle toenails. Do not apply lotion between your toes.  Trim your toenails straight across. Do not dig under them or around the cuticle. File the edges of your nails with an emery board or nail file.  Do not cut corns or calluses or try to remove them with medicine.  Wear clean socks or stockings every day. Make sure they are not too tight. Do not wear knee-high stockings since they may decrease blood flow to your legs.  Wear shoes that fit properly and have enough cushioning. To break in new shoes, wear them for just a few hours a day. This prevents you from injuring your feet. Always look in your shoes before you put them on to be sure there are no objects inside.  Do not cross your legs. This may decrease the blood flow to your feet.  If you find a minor scrape,  cut, or break in the skin on your feet, keep it and the skin around it clean and dry. These areas may be cleansed with mild soap and water. Do not cleanse the area with peroxide, alcohol, or iodine.  When you remove an adhesive bandage, be sure not to damage the skin around it.  If you have a wound, look at it several times a day to make sure it is healing.  Do not use heating pads or hot water bottles. They may burn your skin. If you have lost feeling in your feet or legs, you may not know it is happening until it is too late.  Make sure your health care provider performs a complete foot exam at least annually or more often if you have foot problems. Report any cuts, sores, or bruises to your health care provider immediately. SEEK MEDICAL CARE IF:   You have an injury that is not healing.  You have cuts or breaks in the skin.  You have an ingrown nail.  You notice redness on your legs or feet.  You feel burning or tingling in your legs or feet.  You have pain or cramps in your legs and feet.  Your legs or feet are numb.  Your feet always feel cold. SEEK IMMEDIATE MEDICAL CARE IF:   There is increasing redness,   swelling, or pain in or around a wound.  There is a red line that goes up your leg.  Pus is coming from a wound.  You develop a fever or as directed by your health care provider.  You notice a bad smell coming from an ulcer or wound. Document Released: 08/20/2000 Document Revised: 04/25/2013 Document Reviewed: 01/30/2013 Exit[employer] Patient Information 2015 [employer]. This information is not intended to replace advice given to you by your health care provider. Make sure you discuss any questions you have with your health care provider.  

## 2014-04-11 NOTE — Progress Notes (Signed)
   Subjective:    Patient ID: Terri SaxDorcas B Alvarez, female    DOB: 24-Sep-1927, 80 y.o.   MRN: [id number]  HPI I NEED MY TOENAILS TRIMMED UP    Review of Systems no new findings or systemic changes noted     Objective:   Physical Exam Lower extremity objective findings reveals pedal pulses diminished PT pulse nonpalpable bilateral DP pulse plus one over 4 bilateral capillary refill time 4-5 seconds all digits mild varicosities noted skin temperature warm to cool turgor diminished nails thick criptotic incurvated and brittle crumbly with discoloration 1 through 5 most significant hallux bilateral. There is decreased sensation Semmes Weinstein testing to forefoot digits and arch. Orthopedic exam unremarkable mild digital contractures noted with limb length discrepancy and gait abnormalities identified.      Assessment & Plan:  Assessment diabetes with history peripheral neuropathy and complications thick dystrophic friable mycotic nails 1 through 5 bilateral debridement at this time continued palliative care and followup in 3 months as recommended  Alvan Dameichard Rickayla Wieland DPM

## 2014-05-07 ENCOUNTER — Telehealth (HOSPITAL_COMMUNITY): Payer: Self-pay | Admitting: *Deleted

## 2014-07-08 ENCOUNTER — Ambulatory Visit (INDEPENDENT_AMBULATORY_CARE_PROVIDER_SITE_OTHER): Payer: PRIVATE HEALTH INSURANCE

## 2014-07-08 VITALS — BP 122/86 | HR 72 | Resp 12

## 2014-07-08 DIAGNOSIS — B351 Tinea unguium: Secondary | ICD-10-CM

## 2014-07-08 DIAGNOSIS — E1140 Type 2 diabetes mellitus with diabetic neuropathy, unspecified: Secondary | ICD-10-CM

## 2014-07-08 DIAGNOSIS — M79606 Pain in leg, unspecified: Secondary | ICD-10-CM

## 2014-07-08 DIAGNOSIS — I739 Peripheral vascular disease, unspecified: Secondary | ICD-10-CM

## 2014-07-08 NOTE — Progress Notes (Signed)
   Subjective:    Patient ID: Terri Alvarez, female    DOB: 1928-05-02, 80 y.o.   MRN: [id number]  HPI  ''TRIM MY TOENAILS.''  Review of Systemsno new findings or systemic changes noted     Objective:   Physical Exam Lower extremity objective findings reveal diminished pedal pulses PT nonpalpable bilateral DP plus one over 4 bilateral capillary refill time 5 seconds epicritic and proprioceptive sensations are diminished on Semmes Weinstein to forefoot digits however patient is hyperesthesia on the toes on palpation and debridement at this time. Nails thick brittle crumbly friable criptotic incurvated 1 through 5 bilateral painful on palpation and with enclosed shoe wear should note patient also some mild varicosities no open wounds no ulcers no secondary infections semirigid digital contractures are noted 8 abnormalities ambulation difficulties noted.      Assessment & Plan:  Assessment diabetes history peripheral neuropathy and angiopathy. Dystrophic painful mycotic nails debrided 1 through 5 bilateral painful tender and symptomatic friable nails return for future palliative diabetic foot and mycotic nail care is needed suggest 3 month follow-up next  Alvan Dameichard Isidore Margraf DPM

## 2014-07-08 NOTE — Patient Instructions (Signed)
Diabetes and Foot Care Diabetes may cause you to have problems because of poor blood supply (circulation) to your feet and legs. This may cause the skin on your feet to become thinner, break easier, and heal more slowly. Your skin may become dry, and the skin may peel and crack. You may also have nerve damage in your legs and feet causing decreased feeling in them. You may not notice minor injuries to your feet that could lead to infections or more serious problems. Taking care of your feet is one of the most important things you can do for yourself.  HOME CARE INSTRUCTIONS  Wear shoes at all times, even in the house. Do not go barefoot. Bare feet are easily injured.  Check your feet daily for blisters, cuts, and redness. If you cannot see the bottom of your feet, use a mirror or ask someone for help.  Wash your feet with warm water (do not use hot water) and mild soap. Then pat your feet and the areas between your toes until they are completely dry. Do not soak your feet as this can dry your skin.  Apply a moisturizing lotion or petroleum jelly (that does not contain alcohol and is unscented) to the skin on your feet and to dry, brittle toenails. Do not apply lotion between your toes.  Trim your toenails straight across. Do not dig under them or around the cuticle. File the edges of your nails with an emery board or nail file.  Do not cut corns or calluses or try to remove them with medicine.  Wear clean socks or stockings every day. Make sure they are not too tight. Do not wear knee-high stockings since they may decrease blood flow to your legs.  Wear shoes that fit properly and have enough cushioning. To break in new shoes, wear them for just a few hours a day. This prevents you from injuring your feet. Always look in your shoes before you put them on to be sure there are no objects inside.  Do not cross your legs. This may decrease the blood flow to your feet.  If you find a minor scrape,  cut, or break in the skin on your feet, keep it and the skin around it clean and dry. These areas may be cleansed with mild soap and water. Do not cleanse the area with peroxide, alcohol, or iodine.  When you remove an adhesive bandage, be sure not to damage the skin around it.  If you have a wound, look at it several times a day to make sure it is healing.  Do not use heating pads or hot water bottles. They may burn your skin. If you have lost feeling in your feet or legs, you may not know it is happening until it is too late.  Make sure your health care provider performs a complete foot exam at least annually or more often if you have foot problems. Report any cuts, sores, or bruises to your health care provider immediately. SEEK MEDICAL CARE IF:   You have an injury that is not healing.  You have cuts or breaks in the skin.  You have an ingrown nail.  You notice redness on your legs or feet.  You feel burning or tingling in your legs or feet.  You have pain or cramps in your legs and feet.  Your legs or feet are numb.  Your feet always feel cold. SEEK IMMEDIATE MEDICAL CARE IF:   There is increasing redness,   swelling, or pain in or around a wound.  There is a red line that goes up your leg.  Pus is coming from a wound.  You develop a fever or as directed by your health care provider.  You notice a bad smell coming from an ulcer or wound. Document Released: 08/20/2000 Document Revised: 04/25/2013 Document Reviewed: 01/30/2013 Exit[employer] Patient Information 2015 [employer]. This information is not intended to replace advice given to you by your health care provider. Make sure you discuss any questions you have with your health care provider.  

## 2014-07-10 ENCOUNTER — Ambulatory Visit: Payer: PRIVATE HEALTH INSURANCE

## 2014-08-15 ENCOUNTER — Encounter (HOSPITAL_COMMUNITY): Payer: Self-pay | Admitting: Cardiovascular Disease

## 2014-09-02 ENCOUNTER — Encounter (HOSPITAL_COMMUNITY): Payer: Self-pay | Admitting: *Deleted

## 2014-09-02 ENCOUNTER — Telehealth: Payer: Self-pay | Admitting: Cardiovascular Disease

## 2014-09-02 NOTE — Telephone Encounter (Signed)
Closed encounter °

## 2014-09-15 ENCOUNTER — Encounter (HOSPITAL_COMMUNITY): Payer: Self-pay | Admitting: Emergency Medicine

## 2014-09-15 ENCOUNTER — Emergency Department (HOSPITAL_COMMUNITY): Payer: Medicare Other

## 2014-09-15 ENCOUNTER — Emergency Department (HOSPITAL_COMMUNITY)
Admission: EM | Admit: 2014-09-15 | Discharge: 2014-09-15 | Disposition: A | Discharge status: Home / Self Care | Payer: Medicare Other | Attending: Emergency Medicine | Admitting: Emergency Medicine

## 2014-09-15 DIAGNOSIS — Z9861 Coronary angioplasty status: Secondary | ICD-10-CM | POA: Diagnosis not present

## 2014-09-15 DIAGNOSIS — Z7982 Long term (current) use of aspirin: Secondary | ICD-10-CM | POA: Diagnosis not present

## 2014-09-15 DIAGNOSIS — E119 Type 2 diabetes mellitus without complications: Secondary | ICD-10-CM | POA: Diagnosis not present

## 2014-09-15 DIAGNOSIS — K5900 Constipation, unspecified: Secondary | ICD-10-CM | POA: Diagnosis present

## 2014-09-15 DIAGNOSIS — Z7902 Long term (current) use of antithrombotics/antiplatelets: Secondary | ICD-10-CM | POA: Insufficient documentation

## 2014-09-15 DIAGNOSIS — R011 Cardiac murmur, unspecified: Secondary | ICD-10-CM | POA: Insufficient documentation

## 2014-09-15 DIAGNOSIS — Z79899 Other long term (current) drug therapy: Secondary | ICD-10-CM | POA: Diagnosis not present

## 2014-09-15 DIAGNOSIS — I10 Essential (primary) hypertension: Secondary | ICD-10-CM | POA: Insufficient documentation

## 2014-09-15 DIAGNOSIS — M1990 Unspecified osteoarthritis, unspecified site: Secondary | ICD-10-CM | POA: Insufficient documentation

## 2014-09-15 LAB — URINALYSIS, ROUTINE W REFLEX MICROSCOPIC
BILIRUBIN URINE: NEGATIVE
Glucose, UA: 100 mg/dL — AB
Ketones, ur: 15 mg/dL — AB
Leukocytes, UA: NEGATIVE
Nitrite: NEGATIVE
Protein, ur: 30 mg/dL — AB
SPECIFIC GRAVITY, URINE: 1.011 (ref 1.005–1.030)
Urobilinogen, UA: 0.2 mg/dL (ref 0.0–1.0)
pH: 7 (ref 5.0–8.0)

## 2014-09-15 LAB — CBC WITH DIFFERENTIAL/PLATELET
BASOS PCT: 0 % (ref 0–1)
Basophils Absolute: 0 10*3/uL (ref 0.0–0.1)
EOS ABS: 0.2 10*3/uL (ref 0.0–0.7)
Eosinophils Relative: 2 % (ref 0–5)
HCT: 39.4 % (ref 36.0–46.0)
Hemoglobin: 13.2 g/dL (ref 12.0–15.0)
LYMPHS ABS: 1.7 10*3/uL (ref 0.7–4.0)
Lymphocytes Relative: 20 % (ref 12–46)
MCH: 31.1 pg (ref 26.0–34.0)
MCHC: 33.5 g/dL (ref 30.0–36.0)
MCV: 92.7 fL (ref 78.0–100.0)
MONOS PCT: 6 % (ref 3–12)
Monocytes Absolute: 0.5 10*3/uL (ref 0.1–1.0)
Neutro Abs: 6.1 10*3/uL (ref 1.7–7.7)
Neutrophils Relative %: 72 % (ref 43–77)
Platelets: 180 10*3/uL (ref 150–400)
RBC: 4.25 MIL/uL (ref 3.87–5.11)
RDW: 12.6 % (ref 11.5–15.5)
WBC: 8.5 10*3/uL (ref 4.0–10.5)

## 2014-09-15 LAB — COMPREHENSIVE METABOLIC PANEL
ALBUMIN: 4.5 g/dL (ref 3.5–5.2)
ALT: 17 U/L (ref 0–35)
AST: 24 U/L (ref 0–37)
Alkaline Phosphatase: 86 U/L (ref 39–117)
Anion gap: 12 (ref 5–15)
BUN: 28 mg/dL — ABNORMAL HIGH (ref 6–23)
CALCIUM: 9.7 mg/dL (ref 8.4–10.5)
CO2: 25 mmol/L (ref 19–32)
CREATININE: 1.26 mg/dL — AB (ref 0.50–1.10)
Chloride: 106 mEq/L (ref 96–112)
GFR calc Af Amer: 44 mL/min — ABNORMAL LOW (ref >90)
GFR, EST NON AFRICAN AMERICAN: 38 mL/min — AB (ref >90)
GLUCOSE: 194 mg/dL — AB (ref 70–99)
Potassium: 4.1 mmol/L (ref 3.5–5.1)
Sodium: 143 mmol/L (ref 135–145)
Total Bilirubin: 0.5 mg/dL (ref 0.3–1.2)
Total Protein: 7.2 g/dL (ref 6.0–8.3)

## 2014-09-15 LAB — URINE MICROSCOPIC-ADD ON

## 2014-09-15 MED ORDER — MILK AND MOLASSES ENEMA
1.0000 | Freq: Once | RECTAL | Status: AC
  Administered 2014-09-15: 250 mL via RECTAL
  Filled 2014-09-15: qty 250

## 2014-09-15 MED ORDER — SODIUM CHLORIDE 0.9 % IV BOLUS (SEPSIS)
500.0000 mL | Freq: Once | INTRAVENOUS | Status: AC
  Administered 2014-09-15: 500 mL via INTRAVENOUS

## 2014-09-15 NOTE — ED Notes (Signed)
Pt. Stated, Im constipated, last normal was a couple of days ago.

## 2014-09-15 NOTE — ED Provider Notes (Signed)
CSN: [id number]     Arrival date & time 09/15/14  1027 History   First MD Initiated Contact with Patient 09/15/14 1053     Chief Complaint  Patient presents with  . Constipation     (Consider location/radiation/quality/duration/timing/severity/associated sxs/prior Treatment) HPI  Terri Alvarez is a 81 y.o. female with PMH of hypertension, hyperlipidemia, DM, peripheral vascular disease, constipation presenting with constipation. Patient states she has felt bloated for the past 2-3 days. Patient had bowel movement yesterday which he described as loose. No blood or melena. Patient denies any abdominal pain. She has taken stool softeners without any relief. She denies taking anything else for her pain. No fevers or chills. Patient eating and drinking like normal. Patient denies any nausea, vomiting. Patient states her abdomen is distended. Patient denies any vaginal or pelvic complaints.   Past Medical History  Diagnosis Date  . Hypertension 11/2007    2D echo - EF-55  . Peripheral vascular disease   . Critical lower limb ischemia, rt. foot. 01/04/2012  . Abnormal ankle brachial index, rt. infrapopliteal disease 01/04/2012  . Dissection of other artery, rt. SFA    01/04/2012  . S/P angioplasty, patch of rt. below the knee popliteal artery  01/04/2012  . HTN (hypertension) 01/04/2012  . Hyperlipemia 01/04/2012  . Abnormal TSH 01/04/2012  . DM (diabetes mellitus), type 2, non insulin depent.   . Arthritis     "probably a little" (12/15/2012)  . History of blood transfusion 12/2011    "during OR" (12/15/2012)  . Systolic murmur   . Edema 10/10/2012    Venous doppler   Past Surgical History  Procedure Laterality Date  . Embolectomy  01/03/2012    Procedure: EMBOLECTOMY;  Surgeon: Fransisco Hertz, MD;  Location: [facility] OR;  Service: Vascular;  Laterality: Right;  with vein patch angioplasty  . Lower extremity angiogram  01/03/2012    Procedure: LOWER EXTREMITY ANGIOGRAM;  Surgeon: Fransisco Hertz, MD;   Location: [facility] OR;  Service: Vascular;  Laterality: Right;  . Sp pta add peripheral Right 12/15/2012    SFA   . Femoral artery - tibial artery bypass graft Right 01/03/2012  . Vascular surgery  01/03/12    R femoral-tibial bypass and thrombectomy  . Joint replacement    . Total hip arthroplasty Right 2013    "Dr. Loralie Champagne; [place]" (12/15/2012)  . Atherectomy N/A 01/03/2012    Procedure: ATHERECTOMY;  Surgeon: Runell Gess, MD;  Location: [facility] C[facility] LAB;  Service: Cardiovascular;  Laterality: N/A;  . Lower extremity angiogram Bilateral 12/15/2012    Procedure: LOWER EXTREMITY ANGIOGRAM;  Surgeon: Runell Gess, MD;  Location: [facility] CATH LAB;  Service: Cardiovascular;  Laterality: Bilateral;   Family History  Problem Relation Age of Onset  . Hypertension Mother   . Leukemia Father    History  Substance Use Topics  . Smoking status: Never Smoker   . Smokeless tobacco: Never Used  . Alcohol Use: No   OB History    No data available     Review of Systems  Constitutional: Negative for fever and chills.  HENT: Negative for congestion and rhinorrhea.   Eyes: Negative for visual disturbance.  Respiratory: Negative for cough and shortness of breath.   Cardiovascular: Negative for chest pain and palpitations.  Gastrointestinal: Positive for constipation. Negative for nausea, vomiting, abdominal pain and diarrhea.  Genitourinary: Negative for dysuria and hematuria.  Musculoskeletal: Negative for back pain and gait problem.  Skin: Negative for rash.  Neurological: Negative  for weakness and headaches.      Allergies  Review of patient's allergies indicates no known allergies.  Home Medications   Prior to Admission medications   Medication Sig Start Date End Date Taking? Authorizing Provider  alendronate (FOSAMAX) 70 MG tablet Take 70 mg by mouth once a week. 08/07/14  Yes Historical Provider, MD  Calcium Carbonate-Vitamin D (CALCIUM + D PO) Take 1 tablet by mouth daily.   Yes  Historical Provider, MD  carvedilol (COREG) 6.25 MG tablet Take 1 tablet (6.25 mg total) by mouth 2 (two) times daily with a meal. 11/06/13  Yes Runell Gess, MD  cilostazol (PLETAL) 100 MG tablet Take 1 tablet (100 mg total) by mouth daily. 11/06/13  Yes Runell Gess, MD  clopidogrel (PLAVIX) 75 MG tablet Take 1 tablet (75 mg total) by mouth daily. 11/06/13  Yes Runell Gess, MD  furosemide (LASIX) 20 MG tablet Take 2 tablets (40 mg total) by mouth daily. Patient taking differently: Take 20 mg by mouth daily.  11/06/13  Yes Runell Gess, MD  lisinopril (PRINIVIL,ZESTRIL) 20 MG tablet Take 20 mg by mouth daily.   Yes Historical Provider, MD  metFORMIN (GLUCOPHAGE) 1000 MG tablet Take 1,000 mg by mouth daily with breakfast.    Yes Historical Provider, MD  Multiple Vitamin (MULITIVITAMIN WITH MINERALS) TABS Take 1 tablet by mouth daily.   Yes Historical Provider, MD  potassium chloride (K-DUR) 10 MEQ tablet Take 10 mEq by mouth 2 (two) times daily.   Yes Historical Provider, MD  TRAVATAN Z 0.004 % SOLN ophthalmic solution Place 1 drop into both eyes daily. 08/12/13  Yes Historical Provider, MD  aspirin EC 81 MG tablet Take 1 tablet (81 mg total) by mouth daily. 12/16/12   Brittainy M Simmons, PA-C   BP 180/84 mmHg  Pulse 95  Temp(Src) 98 F (36.7 C) (Oral)  Resp 16  Ht 5' (1.524 m)  Wt 120 lb (54.432 kg)  BMI 23.44 kg/m2  SpO2 96% Physical Exam  Constitutional: She appears well-developed and well-nourished. No distress.  HENT:  Head: Normocephalic and atraumatic.  Mouth/Throat: Oropharynx is clear and moist.  Eyes: Conjunctivae and EOM are normal. Right eye exhibits no discharge. Left eye exhibits no discharge.  Cardiovascular: Normal rate, regular rhythm and normal heart sounds.   Pulmonary/Chest: Effort normal and breath sounds normal. No respiratory distress. She has no wheezes.  Abdominal: Soft. Bowel sounds are normal. There is no tenderness. There is no rebound and no  guarding.  Mild distention  Neurological: She is alert. She exhibits normal muscle tone. Coordination normal.  Skin: Skin is warm and dry. She is not diaphoretic.  Nursing note and vitals reviewed.   ED Course  Procedures (including critical care time) Labs Review Labs Reviewed  COMPREHENSIVE METABOLIC PANEL - Abnormal; Notable for the following:    Glucose, Bld 194 (*)    BUN 28 (*)    Creatinine, Ser 1.26 (*)    GFR calc non Af Amer 38 (*)    GFR calc Af Amer 44 (*)    All other components within normal limits  URINALYSIS, ROUTINE W REFLEX MICROSCOPIC - Abnormal; Notable for the following:    APPearance CLOUDY (*)    Glucose, UA 100 (*)    Hgb urine dipstick TRACE (*)    Ketones, ur 15 (*)    Protein, ur 30 (*)    All other components within normal limits  CBC WITH DIFFERENTIAL  URINE MICROSCOPIC-ADD ON  Imaging Review Dg Abd Acute W/chest  09/15/2014   CLINICAL DATA:  Two day history of constipation  EXAM: ACUTE ABDOMEN SERIES (ABDOMEN 2 VIEW & CHEST 1 VIEW)  COMPARISON:  December 18, 2013  FINDINGS: PA chest: There is minimal left base atelectasis. Lungs elsewhere clear. Heart size and pulmonary vascularity are normal. No adenopathy. There is atherosclerotic change in the aortic arch.  Supine and upright abdomen: There is diffuse stool throughout the colon. Bowel gas pattern is unremarkable. No obstruction or free air. There is splenic artery calcification. There is atherosclerotic change throughout the aorta. There is a total hip prosthesis on the right.  IMPRESSION: Diffuse stool throughout colon. Bowel gas pattern unremarkable. No lung edema or consolidation. Minimal atelectasis left base.   Electronically Signed   By: Bretta BangWilliam  Woodruff M.D.   On: 09/15/2014 13:04     EKG Interpretation None      MDM   Final diagnoses:  Constipation   Pt presenting with constipation. No other abdominal symptoms. No blood in stool. VSS. Benign abdominal exam. No anemia, electrolyte  abnormalities or leukocytosis. UA with evidence of dehydration. Fluid bolus given. X-ray with unremarkable bowel pattern with diffuse stool. No air-fluid levels. Pt given enema in ED and had BM and feeling better. Pt to continue with stool softener. Patient to start Metamucil. Pt to use Miralax for persistent symptoms.  Discussed return precautions with patient. Discussed all results and patient verbalizes understanding and agrees with plan.  This is a shared patient. This patient was discussed with the physician, Dr. Lynelle DoctorKnapp who saw and evaluated the patient and agrees with the plan.    Louann SjogrenVictoria L Rayah Fines, PA-C 09/16/14 2339  Linwood DibblesJon Knapp, MD 09/18/14 [phone]

## 2014-09-15 NOTE — ED Notes (Signed)
Received red rectal cath called pharmacy for status of medication stated will send ASAP to POD A.  Patient used bedside commode to thought would able to have a BM did not urinated instead without incident.

## 2014-09-15 NOTE — ED Notes (Signed)
Sent an A11 request form for red rectal tube.

## 2014-09-15 NOTE — Discharge Instructions (Signed)
Return to the emergency room with worsening of symptoms, new symptoms or with symptoms that are concerning, especially fevers, vomiting, blood in vomit or stool, severe abdominal pain, severe back pain. Continue to use your stool softener. Add metamucil daily as well as miralax as needed for constipation. Miralax: 3 capfuls in large Gatorade over the course of the day when stool becomes loose decrease to one capful a day for 1 week.  Please call your doctor for a followup appointment within 24-48 hours. When you talk to your doctor please let them know that you were seen in the emergency department and have them acquire all of your records so that they can discuss the findings with you and formulate a treatment plan to fully care for your new and ongoing problems. If you do not have a primary care provider please call the number below under ED resources to establish care with a provider and follow up.    Constipation Constipation is when a person has fewer than three bowel movements a week, has difficulty having a bowel movement, or has stools that are dry, hard, or larger than normal. As people grow older, constipation is more common. If you try to fix constipation with medicines that make you have a bowel movement (laxatives), the problem may get worse. Long-term laxative use may cause the muscles of the colon to become weak. A low-fiber diet, not taking in enough fluids, and taking certain medicines may make constipation worse.  CAUSES   Certain medicines, such as antidepressants, pain medicine, iron supplements, antacids, and water pills.   Certain diseases, such as diabetes, irritable bowel syndrome (IBS), thyroid disease, or depression.   Not drinking enough water.   Not eating enough fiber-rich foods.   Stress or travel.   Lack of physical activity or exercise.   Ignoring the urge to have a bowel movement.   Using laxatives too much.  SIGNS AND SYMPTOMS   Having fewer than  three bowel movements a week.   Straining to have a bowel movement.   Having stools that are hard, dry, or larger than normal.   Feeling full or bloated.   Pain in the lower abdomen.   Not feeling relief after having a bowel movement.  DIAGNOSIS  Your health care provider will take a medical history and perform a physical exam. Further testing may be done for severe constipation. Some tests may include:  A barium enema X-ray to examine your rectum, colon, and, sometimes, your small intestine.   A sigmoidoscopy to examine your lower colon.   A colonoscopy to examine your entire colon. TREATMENT  Treatment will depend on the severity of your constipation and what is causing it. Some dietary treatments include drinking more fluids and eating more fiber-rich foods. Lifestyle treatments may include regular exercise. If these diet and lifestyle recommendations do not help, your health care provider may recommend taking over-the-counter laxative medicines to help you have bowel movements. Prescription medicines may be prescribed if over-the-counter medicines do not work.  HOME CARE INSTRUCTIONS   Eat foods that have a lot of fiber, such as fruits, vegetables, whole grains, and beans.  Limit foods high in fat and processed sugars, such as french fries, hamburgers, cookies, candies, and soda.   A fiber supplement may be added to your diet if you cannot get enough fiber from foods.   Drink enough fluids to keep your urine clear or pale yellow.   Exercise regularly or as directed by your health care provider.  Go to the restroom when you have the urge to go. Do not hold it.   Only take over-the-counter or prescription medicines as directed by your health care provider. Do not take other medicines for constipation without talking to your health care provider first.  SEEK IMMEDIATE MEDICAL CARE IF:   You have bright red blood in your stool.   Your constipation lasts for  more than 4 days or gets worse.   You have abdominal or rectal pain.   You have thin, pencil-like stools.   You have unexplained weight loss. MAKE SURE YOU:   Understand these instructions.  Will watch your condition.  Will get help right away if you are not doing well or get worse. Document Released: 05/21/2004 Document Revised: 08/28/2013 Document Reviewed: 06/04/2013 [facility] Patient Information 2015 [place], [place]. This information is not intended to replace advice given to you by your health care provider. Make sure you discuss any questions you have with your health care provider.

## 2014-10-07 ENCOUNTER — Ambulatory Visit: Payer: PRIVATE HEALTH INSURANCE

## 2014-11-21 ENCOUNTER — Telehealth: Payer: Self-pay | Admitting: Cardiovascular Disease

## 2014-11-25 ENCOUNTER — Other Ambulatory Visit (HOSPITAL_COMMUNITY): Payer: Self-pay | Admitting: Cardiovascular Disease

## 2014-11-25 DIAGNOSIS — I6529 Occlusion and stenosis of unspecified carotid artery: Secondary | ICD-10-CM

## 2014-11-25 DIAGNOSIS — I739 Peripheral vascular disease, unspecified: Secondary | ICD-10-CM

## 2014-11-25 NOTE — Telephone Encounter (Signed)
Closed encounter °

## 2014-12-10 ENCOUNTER — Ambulatory Visit (HOSPITAL_BASED_OUTPATIENT_CLINIC_OR_DEPARTMENT_OTHER)
Admission: RE | Admit: 2014-12-10 | Discharge: 2014-12-10 | Disposition: A | Discharge status: Home / Self Care | Payer: Medicare Other | Source: Ambulatory Visit | Attending: Cardiology | Admitting: Cardiology

## 2014-12-10 ENCOUNTER — Ambulatory Visit (HOSPITAL_COMMUNITY)
Admission: RE | Admit: 2014-12-10 | Discharge: 2014-12-10 | Disposition: A | Discharge status: Home / Self Care | Payer: Medicare Other | Source: Ambulatory Visit | Attending: Cardiology | Admitting: Cardiology

## 2014-12-10 ENCOUNTER — Ambulatory Visit: Payer: Medicare Other | Admitting: Cardiovascular Disease

## 2014-12-10 DIAGNOSIS — I739 Peripheral vascular disease, unspecified: Secondary | ICD-10-CM | POA: Insufficient documentation

## 2014-12-10 DIAGNOSIS — I6529 Occlusion and stenosis of unspecified carotid artery: Secondary | ICD-10-CM | POA: Insufficient documentation

## 2014-12-10 NOTE — Progress Notes (Signed)
Lower extremity arterial duplex completed. °Brianna L Mazza,RVT °

## 2014-12-10 NOTE — Progress Notes (Signed)
Carotid duplex completed. °Brianna L Mazza,RVT °

## 2014-12-12 ENCOUNTER — Encounter: Payer: Self-pay | Admitting: *Deleted

## 2014-12-27 ENCOUNTER — Encounter: Payer: Self-pay | Admitting: Cardiovascular Disease

## 2014-12-27 ENCOUNTER — Ambulatory Visit (INDEPENDENT_AMBULATORY_CARE_PROVIDER_SITE_OTHER): Payer: Medicare Other | Admitting: Cardiovascular Disease

## 2014-12-27 VITALS — BP 146/68 | HR 88 | Ht 60.0 in | Wt 118.0 lb

## 2014-12-27 DIAGNOSIS — I10 Essential (primary) hypertension: Secondary | ICD-10-CM | POA: Diagnosis not present

## 2014-12-27 DIAGNOSIS — I998 Other disorder of circulatory system: Secondary | ICD-10-CM | POA: Diagnosis not present

## 2014-12-27 DIAGNOSIS — I70229 Atherosclerosis of native arteries of extremities with rest pain, unspecified extremity: Secondary | ICD-10-CM

## 2014-12-27 DIAGNOSIS — I779 Disorder of arteries and arterioles, unspecified: Secondary | ICD-10-CM | POA: Diagnosis not present

## 2014-12-27 DIAGNOSIS — I739 Peripheral vascular disease, unspecified: Secondary | ICD-10-CM

## 2014-12-27 DIAGNOSIS — E785 Hyperlipidemia, unspecified: Secondary | ICD-10-CM

## 2014-12-27 MED ORDER — CILOSTAZOL 100 MG PO TABS: 100.0000 mg | ORAL_TABLET | Freq: Every day | ORAL | Status: DC

## 2014-12-27 MED ORDER — POTASSIUM CHLORIDE ER 10 MEQ PO TBCR: 10.0000 meq | EXTENDED_RELEASE_TABLET | Freq: Two times a day (BID) | ORAL | Status: DC

## 2014-12-27 MED ORDER — CLOPIDOGREL BISULFATE 75 MG PO TABS: 75.0000 mg | ORAL_TABLET | Freq: Every day | ORAL | Status: DC

## 2014-12-27 NOTE — Assessment & Plan Note (Signed)
History of hyperlipidemia not on statin therapy. 

## 2014-12-27 NOTE — Progress Notes (Signed)
12/27/2014 Terri Alvarez   1928-02-23  [id number]  Primary Physician Modesto CharonKaur, [place] Primary Cardiologist: Runell GessJonathan J. Christyn Gutkowski MD Roseanne RenoFACP,FACC,FAHA, FSCAI   HPI:  Ms. Terri CobbKent is an 81 year old widowed Caucasian female mother of 3, Temovate and 5 grandchildren who has a history of high blood pressure, hyperlipidemia, non-insulin-requiring diabetes and peripheral vascular occlusive disease. I last saw her in the office one year ago.She had a Myoview performed in 2006 which was nonischemic. Angiogram for 01/03/2012 and it diamondback orbital rotational atherectomy of her right SFA both proximally as well as distally including the popliteal artery. Unfortunately she had distal embolization critical limb ischemia requiring open thrombectomy with restoration of flow. I saw her 6 months ago which is complaining of pain in her right lower extremity with some erythema of her toes and foot and right foot. Over the last 6 months this has gotten progressively worse. She had a high-frequency signal in her distal left SFA and underwent angiography and percutaneous intervention of her right SFA in April of last year with an excellent angiographic result. She does have some reddish discoloration of her distal feet bilaterally and digits but denies pain or claudication. Recent lower extremity arterial Doppler studies performed/5/16 revealed a right ABI 0.88 a left ABI of 0.83. Her right SFA it sites of intervention appeared widely patent. She also has moderate carotid artery disease bilaterally with recent Dopplers performed on the same day that showed mild progression by velocities without change in degree of stenosis. She denies chest pain or shortness of breath.   Current Outpatient Prescriptions  Medication Sig Dispense Refill  . alendronate (FOSAMAX) 70 MG tablet Take 70 mg by mouth once a week.  4  . ANUSOL-HC 2.5 % rectal cream   0  . aspirin EC 81 MG tablet Take 1 tablet (81 mg total) by mouth daily.    .  Calcium Carbonate-Vitamin D (CALCIUM + D PO) Take 1 tablet by mouth daily.    . carvedilol (COREG) 6.25 MG tablet Take 1 tablet (6.25 mg total) by mouth 2 (two) times daily with a meal. 180 tablet 3  . cilostazol (PLETAL) 100 MG tablet Take 1 tablet (100 mg total) by mouth daily. 90 tablet 3  . clopidogrel (PLAVIX) 75 MG tablet Take 1 tablet (75 mg total) by mouth daily. 90 tablet 3  . furosemide (LASIX) 20 MG tablet Take 2 tablets (40 mg total) by mouth daily. (Patient taking differently: Take 20 mg by mouth daily. ) 180 tablet 3  . lisinopril (PRINIVIL,ZESTRIL) 20 MG tablet Take 20 mg by mouth daily.    . Multiple Vitamin (MULITIVITAMIN WITH MINERALS) TABS Take 1 tablet by mouth daily.    . pentoxifylline (TRENTAL) 400 MG CR tablet 3 (three) times daily.  0  . potassium chloride (K-DUR) 10 MEQ tablet Take 1 tablet (10 mEq total) by mouth 2 (two) times daily. 180 tablet 3  . TRAVATAN Z 0.004 % SOLN ophthalmic solution Place 1 drop into both eyes daily.    . traZODone (DESYREL) 50 MG tablet Take 50 mg by mouth at bedtime.  0   No current facility-administered medications for this visit.    No Known Allergies  History   Social History  . Marital Status: Divorced    Spouse Name: N/A  . Number of Children: N/A  . Years of Education: N/A   Occupational History  . Not on file.   Social History Main Topics  . Smoking status: Never Smoker   .  Smokeless tobacco: Never Used  . Alcohol Use: No  . Drug Use: No  . Sexual Activity: No   Other Topics Concern  . Not on file   Social History Narrative     Review of Systems: General: negative for chills, fever, night sweats or weight changes.  Cardiovascular: negative for chest pain, dyspnea on exertion, edema, orthopnea, palpitations, paroxysmal nocturnal dyspnea or shortness of breath Dermatological: negative for rash Respiratory: negative for cough or wheezing Urologic: negative for hematuria Abdominal: negative for nausea, vomiting,  diarrhea, bright red blood per rectum, melena, or hematemesis Neurologic: negative for visual changes, syncope, or dizziness All other systems reviewed and are otherwise negative except as noted above.    Blood pressure 146/68, pulse 88, height 5' (1.524 m), weight 118 lb (53.524 kg).  General appearance: alert and no distress Neck: no adenopathy, no JVD, supple, symmetrical, trachea midline, thyroid not enlarged, symmetric, no tenderness/mass/nodules and ssoft bilateral carotid bruits Lungs: clear to auscultation bilaterally Heart: regular rate and rhythm, S1, S2 normal, no murmur, click, rub or gallop Extremities: extremities normal, atraumatic, no cyanosis or edema  EKG not performed today  ASSESSMENT AND PLAN:   Hyperlipemia History of hyperlipidemia not on statin therapy.   HTN (hypertension) History of hypertension blood pressures measured at 146/68. She is on lisinopril and carvedilol. Continue current meds at current dosing   Critical lower limb ischemia, rt. foot. - S/P PTA of right SFA using cutting ballon with good angiographic result 12/15/12 History of critical limb ischemia with intervention performed 01/03/12 by myself on the proximal and distal right SFA using diamondback atherectomy. She had a follow-up intervention 12/15/12 with chocolate balloon angioplasty of her mid to distal right SFA. She had 2 vessel runoff on the right and 1 vessel runoff on the left. She does have some erythema in her distal metatarsal and digits bilaterally. Recent Doppler was performed 12/10/14 revealed a right ABI 0.8 with a patent right SFA and a left ABI of .83 with mild to moderate mid left SFA disease and moderate left common iliac artery disease which has somewhat progressed since her last Doppler. She denies claudication. There is no evidence of critical limb ischemia at this time.   Bilateral carotid artery disease History of bilateral moderate internal carotid artery stenosis by duplex  ultrasound recently checked 12/10/14. Her velocities have increased mildly in both sides but her category of stenosis have remained moderate at most. She is neurologically a systematic.       Runell Gess MD FACP,FACC,FAHA, [facility] 12/27/2014 12:35 PM

## 2014-12-27 NOTE — Assessment & Plan Note (Signed)
History of bilateral moderate internal carotid artery stenosis by duplex ultrasound recently checked 12/10/14. Her velocities have increased mildly in both sides but her category of stenosis have remained moderate at most. She is neurologically a systematic.

## 2014-12-27 NOTE — Patient Instructions (Signed)
Dr Berry recommends that you schedule a follow-up appointment in 6 months. You will receive a reminder letter in the mail two months in advance. If you don't receive a letter, please call our office to schedule the follow-up appointment. 

## 2014-12-27 NOTE — Assessment & Plan Note (Signed)
History of hypertension blood pressures measured at 146/68. She is on lisinopril and carvedilol. Continue current meds at current dosing

## 2014-12-27 NOTE — Assessment & Plan Note (Signed)
History of critical limb ischemia with intervention performed 01/03/12 by myself on the proximal and distal right SFA using diamondback atherectomy. She had a follow-up intervention 12/15/12 with chocolate balloon angioplasty of her mid to distal right SFA. She had 2 vessel runoff on the right and 1 vessel runoff on the left. She does have some erythema in her distal metatarsal and digits bilaterally. Recent Doppler was performed 12/10/14 revealed a right ABI 0.8 with a patent right SFA and a left ABI of .83 with mild to moderate mid left SFA disease and moderate left common iliac artery disease which has somewhat progressed since her last Doppler. She denies claudication. There is no evidence of critical limb ischemia at this time.

## 2015-03-05 ENCOUNTER — Telehealth (HOSPITAL_COMMUNITY): Payer: Self-pay | Admitting: *Deleted

## 2015-04-03 ENCOUNTER — Other Ambulatory Visit: Payer: Self-pay | Admitting: Cardiovascular Disease

## 2015-04-03 NOTE — Telephone Encounter (Signed)
Rx has been sent to the pharmacy electronically. ° °

## 2015-07-02 ENCOUNTER — Ambulatory Visit (INDEPENDENT_AMBULATORY_CARE_PROVIDER_SITE_OTHER): Payer: Medicare Other | Admitting: Cardiovascular Disease

## 2015-07-02 ENCOUNTER — Encounter: Payer: Self-pay | Admitting: Cardiovascular Disease

## 2015-07-02 ENCOUNTER — Ambulatory Visit (HOSPITAL_COMMUNITY)
Admission: RE | Admit: 2015-07-02 | Discharge: 2015-07-02 | Disposition: A | Discharge status: Home / Self Care | Payer: Medicare Other | Source: Ambulatory Visit | Attending: Cardiovascular Disease | Admitting: Cardiovascular Disease

## 2015-07-02 ENCOUNTER — Other Ambulatory Visit: Payer: Self-pay | Admitting: Cardiovascular Disease

## 2015-07-02 VITALS — BP 170/64 | HR 86 | Ht 60.0 in | Wt 122.0 lb

## 2015-07-02 DIAGNOSIS — Z9862 Peripheral vascular angioplasty status: Secondary | ICD-10-CM | POA: Diagnosis not present

## 2015-07-02 DIAGNOSIS — E119 Type 2 diabetes mellitus without complications: Secondary | ICD-10-CM | POA: Insufficient documentation

## 2015-07-02 DIAGNOSIS — I739 Peripheral vascular disease, unspecified: Secondary | ICD-10-CM

## 2015-07-02 DIAGNOSIS — I10 Essential (primary) hypertension: Secondary | ICD-10-CM | POA: Diagnosis not present

## 2015-07-02 DIAGNOSIS — I779 Disorder of arteries and arterioles, unspecified: Secondary | ICD-10-CM

## 2015-07-02 DIAGNOSIS — E785 Hyperlipidemia, unspecified: Secondary | ICD-10-CM | POA: Insufficient documentation

## 2015-07-02 NOTE — Assessment & Plan Note (Signed)
History of hypertension blood pressure measured at 170/64. She is on lisinopril and carvedilol. Continue current meds at current dosing

## 2015-07-02 NOTE — Progress Notes (Signed)
07/02/2015 Terri Alvarez   02-10-1928  [id number]  Primary Physician Modesto CharonKaur, [place] Primary Cardiologist: Runell GessJonathan J. Amberlin Utke MD Roseanne RenoFACP,FACC,FAHA, FSCAI   HPI:  Terri Alvarez is an 81 year old widowed Caucasian female mother of 3, grandmother of  5 grandchildren who has a history of high blood pressure, hyperlipidemia, non-insulin-requiring diabetes and peripheral vascular occlusive disease. I last saw her in the office/22/16.Marland Kitchen.She had a Myoview performed in 2006 which was nonischemic. I performed lower extremity angiography 01/03/2012 with diamondback orbital rotational atherectomy of her right SFA both proximally as well as distally including the popliteal artery. Unfortunately she had distal embolization critical limb ischemia requiring open thrombectomy with restoration of flow. I saw her 6 months ago which is complaining of pain in her right lower extremity with some erythema of her toes and foot and right foot. Over the last 6 months this has gotten progressively worse. She had a high-frequency signal in her distal left SFA and underwent angiography and percutaneous intervention of her right SFA in April of last year with an excellent angiographic result. She does have some reddish discoloration of her distal feet bilaterally and digits but denies pain or claudication. Her most recent worsening her children Doppler studies performed 12/10/14 revealed a right ABI of 0.88 with a widely patent right SFA and left ABI 0.83 with a patent left SFA as well. Carotid Dopplers performed the same time showed moderate bilateral ICA stenosis. She denies chest pain, shortness of breath or claudication.  Current Outpatient Prescriptions  Medication Sig Dispense Refill  . alendronate (FOSAMAX) 70 MG tablet Take 70 mg by mouth once a week.  4  . ANUSOL-HC 2.5 % rectal cream   0  . aspirin EC 81 MG tablet Take 1 tablet (81 mg total) by mouth daily.    . Calcium Carbonate-Vitamin D (CALCIUM + D PO) Take 1 tablet by  mouth daily.    . carvedilol (COREG) 6.25 MG tablet Take 1 tablet (6.25 mg total) by mouth 2 (two) times daily with a meal. 180 tablet 3  . cilostazol (PLETAL) 100 MG tablet TAKE 1 TABLET BY MOUTH EVERY DAY 90 tablet 1  . clopidogrel (PLAVIX) 75 MG tablet TAKE 1 TABLET BY MOUTH EVERY DAY 90 tablet 1  . furosemide (LASIX) 20 MG tablet TAKE 2 TABLETS BY MOUTH EVERY DAY 180 tablet 1  . KLOR-CON M10 10 MEQ tablet Take 10 mEq by mouth 2 (two) times daily.  3  . lisinopril (PRINIVIL,ZESTRIL) 20 MG tablet Take 20 mg by mouth daily.    . Multiple Vitamin (MULITIVITAMIN WITH MINERALS) TABS Take 1 tablet by mouth daily.    . pentoxifylline (TRENTAL) 400 MG CR tablet 3 (three) times daily.  0  . potassium chloride (K-DUR) 10 MEQ tablet Take 1 tablet (10 mEq total) by mouth 2 (two) times daily. 180 tablet 3  . TRAVATAN Z 0.004 % SOLN ophthalmic solution Place 1 drop into both eyes daily.    . traZODone (DESYREL) 50 MG tablet Take 50 mg by mouth at bedtime.  0   No current facility-administered medications for this visit.    No Known Allergies  Social History   Social History  . Marital Status: Divorced    Spouse Name: N/A  . Number of Children: N/A  . Years of Education: N/A   Occupational History  . Not on file.   Social History Main Topics  . Smoking status: Never Smoker   . Smokeless tobacco: Never Used  . Alcohol Use:  No  . Drug Use: No  . Sexual Activity: No   Other Topics Concern  . Not on file   Social History Narrative     Review of Systems: General: negative for chills, fever, night sweats or weight changes.  Cardiovascular: negative for chest pain, dyspnea on exertion, edema, orthopnea, palpitations, paroxysmal nocturnal dyspnea or shortness of breath Dermatological: negative for rash Respiratory: negative for cough or wheezing Urologic: negative for hematuria Abdominal: negative for nausea, vomiting, diarrhea, bright red blood per rectum, melena, or  hematemesis Neurologic: negative for visual changes, syncope, or dizziness All other systems reviewed and are otherwise negative except as noted above.    Blood pressure 170/64, pulse 86, height 5' (1.524 m), weight 122 lb (55.339 kg).  General appearance: alert and no distress Neck: no adenopathy, no JVD, supple, symmetrical, trachea midline, thyroid not enlarged, symmetric, no tenderness/mass/nodules and soft bilateral carotid bruits Lungs: clear to auscultation bilaterally Heart: regular rate and rhythm, S1, S2 normal, no murmur, click, rub or gallop Extremities: extremities normal, atraumatic, no cyanosis or edema  EKG normal sinus rhythm at 86 with poor R-wave progression consistent with old inferior infarct as well as inferior Q waves and low limb voltage. I personally reviewed this EKG  ASSESSMENT AND PLAN:   S/P angioplasty, patch of rt. below the knee popliteal artery , 01/03/12  and rt. pop. ant tib. and peroneal artery thrombectomy. History of PAD status post intervention 01/03/12 with diamondback orbital rotational atherectomy of her right SFA both proximally as well as distally including the popliteal artery artery. Unfortunately, she had distal embolization with subsequent critical limb ischemia requiring open thrombectomy with restoration of flow. Her most recent Dopplers performed 12/10/14 revealed ABIs in the 0.8 range bilaterally with mildly elevated iliac frequencies and moderate bilateral SFA frequencies. She really denies claudication.  Hyperlipemia History of hyperlipidemia intolerant to statin therapy.  HTN (hypertension) History of hypertension blood pressure measured at 170/64. She is on lisinopril and carvedilol. Continue current meds at current dosing  Bilateral carotid artery disease History of moderate bilateral ICA stenosis by 2 parts ultrasound performed in April of this year followed on an annual basis. She is neurologically asymptomatic on aspirin and  Plavix      Runell Gess MD [facility], [facility] 07/02/2015 10:08 AM

## 2015-07-02 NOTE — Assessment & Plan Note (Signed)
History of PAD status post intervention 01/03/12 with diamondback orbital rotational atherectomy of her right SFA both proximally as well as distally including the popliteal artery artery. Unfortunately, she had distal embolization with subsequent critical limb ischemia requiring open thrombectomy with restoration of flow. Her most recent Dopplers performed 12/10/14 revealed ABIs in the 0.8 range bilaterally with mildly elevated iliac frequencies and moderate bilateral SFA frequencies. She really denies claudication.

## 2015-07-02 NOTE — Patient Instructions (Signed)
Medication Instructions:  Your physician recommends that you continue on your current medications as directed. Please refer to the Current Medication list given to you today.   Labwork: I will get your lab work from your Primary Care Physician.   Testing/Procedures: Your physician has requested that you have a lower extremity arterial doppler- During this test, ultrasound is used to evaluate arterial blood flow in the legs. Allow approximately one hour for this exam.   Your physician has requested that you have a carotid duplex. This test is an ultrasound of the carotid arteries in your neck. It looks at blood flow through these arteries that supply the brain with blood. Allow one hour for this exam. There are no restrictions or special instructions. IN April 2017    Follow-Up: Your physician wants you to follow-up in: 12 months with Dr. Allyson SabalBerry. You will receive a reminder letter in the mail two months in advance. If you don't receive a letter, please call our office to schedule the follow-up appointment.   Any Other Special Instructions Will Be Listed Below (If Applicable).     If you need a refill on your cardiac medications before your next appointment, please call your pharmacy.

## 2015-07-02 NOTE — Assessment & Plan Note (Signed)
History of hyperlipidemia intolerant to statin therapy 

## 2015-07-02 NOTE — Assessment & Plan Note (Signed)
History of moderate bilateral ICA stenosis by 2 parts ultrasound performed in April of this year followed on an annual basis. She is neurologically asymptomatic on aspirin and Plavix

## 2015-09-15 ENCOUNTER — Other Ambulatory Visit: Payer: Self-pay | Admitting: Cardiovascular Disease

## 2015-09-15 NOTE — Telephone Encounter (Signed)
Rx request sent to pharmacy.  

## 2015-12-19 IMAGING — DX DG ABDOMEN ACUTE W/ 1V CHEST
3 series · 3 of 3 positions shown · non-contrast
Comparison: December 18, 2013

CLINICAL DATA: Two day history of constipation

EXAM:
ACUTE ABDOMEN SERIES (ABDOMEN 2 VIEW & CHEST 1 VIEW)

[chest pa]
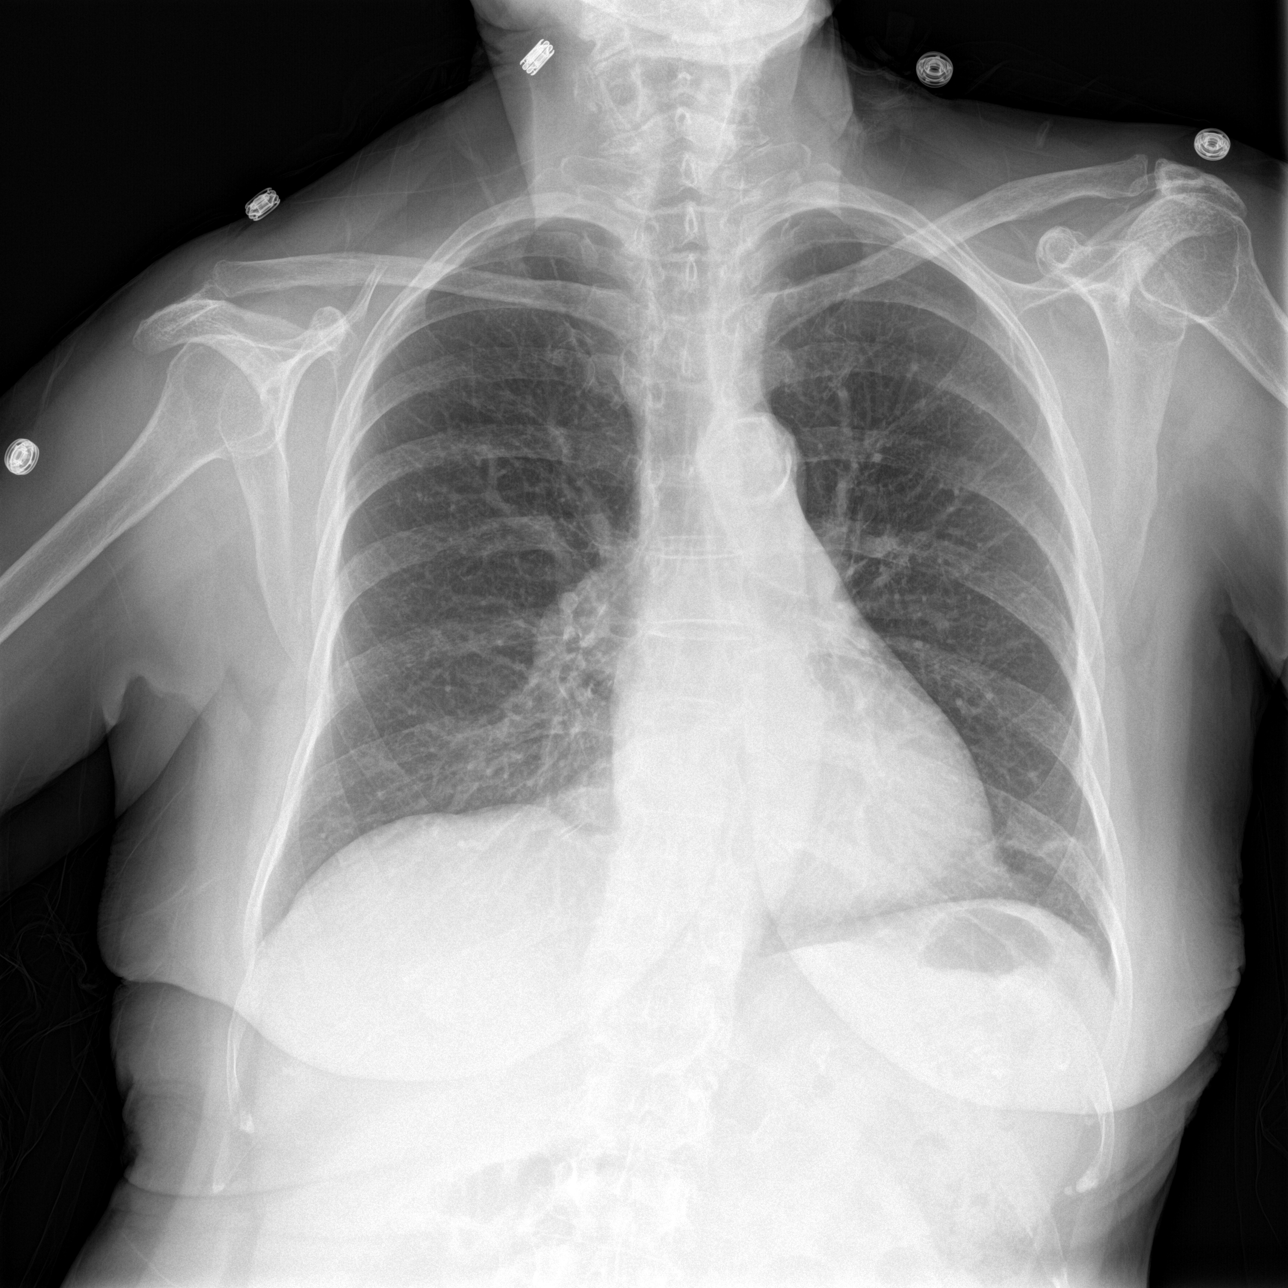

[abdomen erect]
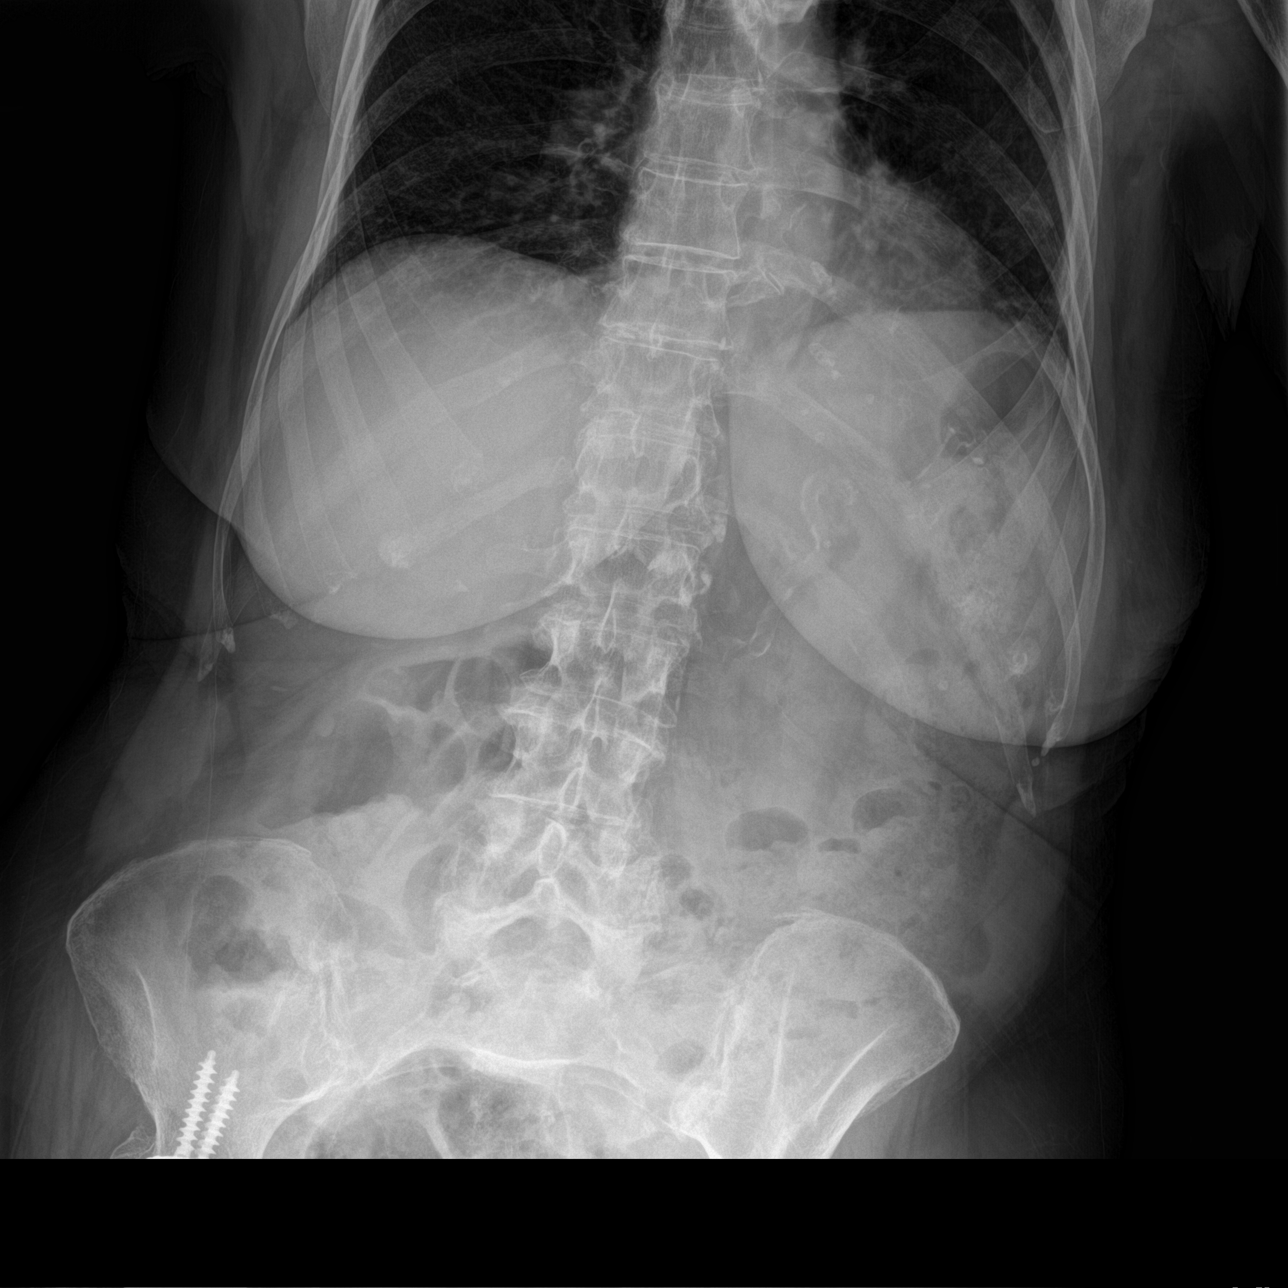

[abdomen supine]
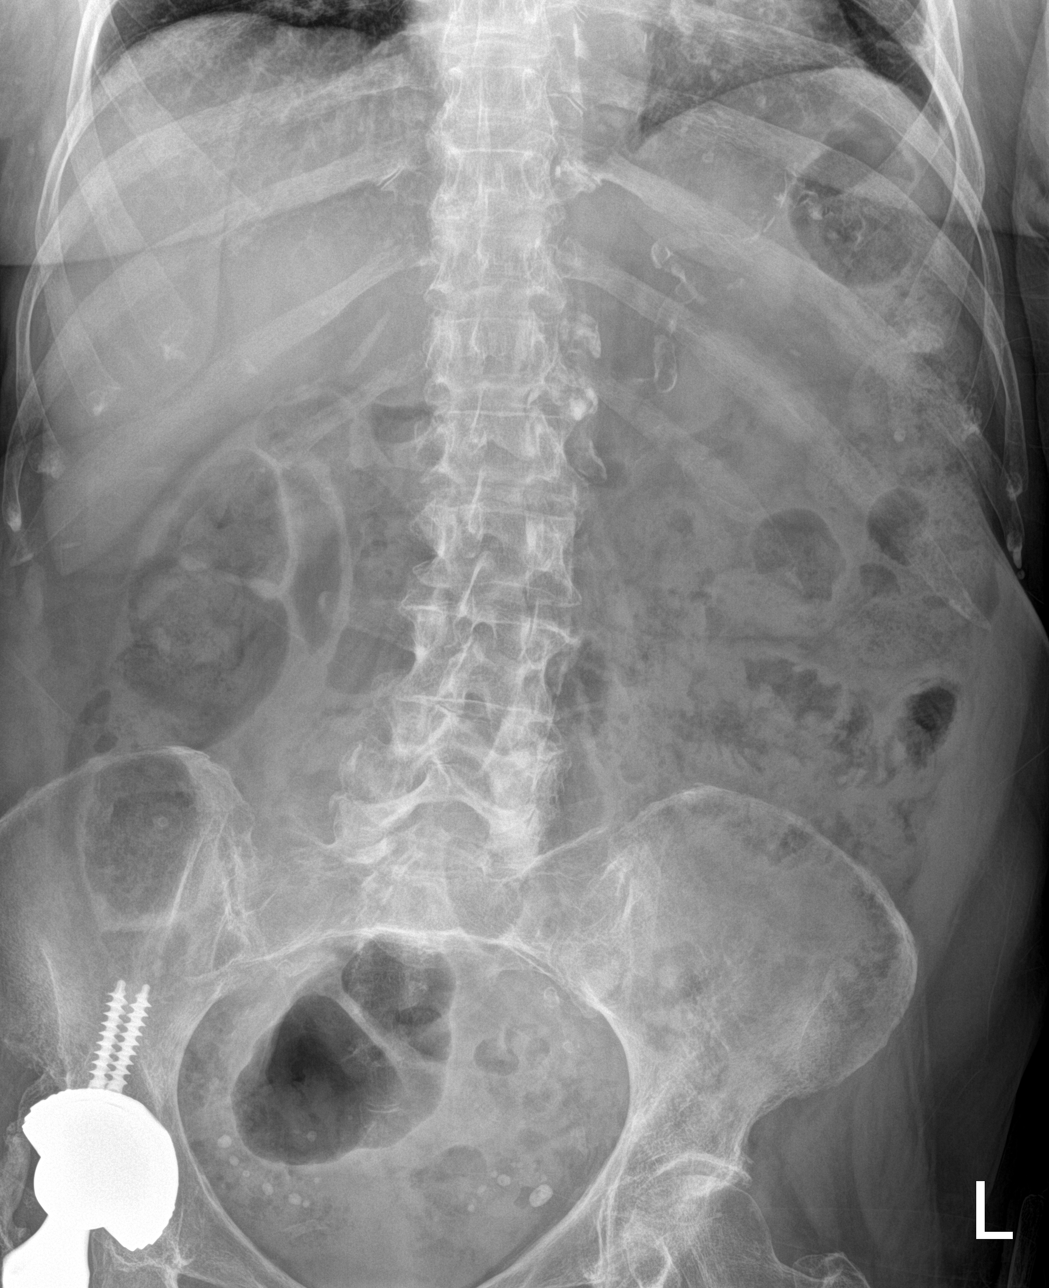

[3 of 3 positions shown; findings below may reference images not displayed]

FINDINGS: PA chest: There is minimal left base atelectasis. Lungs elsewhere
clear. Heart size and pulmonary vascularity are normal. No
adenopathy. There is atherosclerotic change in the aortic arch.

Supine and upright abdomen: There is diffuse stool throughout the
colon. Bowel gas pattern is unremarkable. No obstruction or free
air. There is splenic artery calcification. There is atherosclerotic
change throughout the aorta. There is a total hip prosthesis on the
right.
IMPRESSION: Diffuse stool throughout colon. Bowel gas pattern unremarkable. No
lung edema or consolidation. Minimal atelectasis left base.

## 2015-12-30 ENCOUNTER — Other Ambulatory Visit: Payer: Self-pay | Admitting: Cardiovascular Disease

## 2015-12-30 DIAGNOSIS — I6523 Occlusion and stenosis of bilateral carotid arteries: Secondary | ICD-10-CM

## 2016-01-02 ENCOUNTER — Ambulatory Visit (HOSPITAL_COMMUNITY)
Admission: RE | Admit: 2016-01-02 | Discharge: 2016-01-02 | Disposition: A | Discharge status: Home / Self Care | Payer: Medicare Other | Source: Ambulatory Visit | Attending: Urology | Admitting: Urology

## 2016-01-02 DIAGNOSIS — I10 Essential (primary) hypertension: Secondary | ICD-10-CM | POA: Diagnosis not present

## 2016-01-02 DIAGNOSIS — I6523 Occlusion and stenosis of bilateral carotid arteries: Secondary | ICD-10-CM | POA: Diagnosis not present

## 2016-01-02 DIAGNOSIS — E785 Hyperlipidemia, unspecified: Secondary | ICD-10-CM | POA: Insufficient documentation

## 2016-01-02 DIAGNOSIS — E119 Type 2 diabetes mellitus without complications: Secondary | ICD-10-CM | POA: Insufficient documentation

## 2016-01-06 ENCOUNTER — Telehealth: Payer: Self-pay | Admitting: *Deleted

## 2016-01-06 DIAGNOSIS — I739 Peripheral vascular disease, unspecified: Secondary | ICD-10-CM

## 2016-01-06 DIAGNOSIS — I779 Disorder of arteries and arterioles, unspecified: Secondary | ICD-10-CM

## 2016-01-06 NOTE — Telephone Encounter (Signed)
Results called to pt. She verbalized understanding. While on the phone call she asked if she was do for her lower extremity arterial doppler. She last had the procedure in 06/2015 and Dr Hazle CocaBerry's note said to repeat in 6 months. New order entered for lower extremity arterial. Pt knows to call to schedule the appt if she does not hear from our office.

## 2016-01-06 NOTE — Telephone Encounter (Signed)
-----   Message from Jonathan J Berry, MD sent at 01/06/2016 10:05 AM EDT ----- No change from prior study. Repeat in 12 months. 

## 2016-02-03 ENCOUNTER — Other Ambulatory Visit: Payer: Self-pay | Admitting: Cardiovascular Disease

## 2016-02-03 DIAGNOSIS — I779 Disorder of arteries and arterioles, unspecified: Secondary | ICD-10-CM

## 2016-02-04 ENCOUNTER — Inpatient Hospital Stay (HOSPITAL_COMMUNITY): Admission: RE | Admit: 2016-02-04 | Payer: Medicare Other | Source: Ambulatory Visit

## 2016-02-04 ENCOUNTER — Ambulatory Visit (HOSPITAL_COMMUNITY)
Admission: RE | Admit: 2016-02-04 | Discharge: 2016-02-04 | Disposition: A | Discharge status: Home / Self Care | Payer: Medicare Other | Source: Ambulatory Visit | Attending: Cardiovascular Disease | Admitting: Cardiovascular Disease

## 2016-02-04 DIAGNOSIS — I10 Essential (primary) hypertension: Secondary | ICD-10-CM | POA: Diagnosis not present

## 2016-02-04 DIAGNOSIS — E119 Type 2 diabetes mellitus without complications: Secondary | ICD-10-CM | POA: Diagnosis not present

## 2016-02-04 DIAGNOSIS — I70201 Unspecified atherosclerosis of native arteries of extremities, right leg: Secondary | ICD-10-CM | POA: Insufficient documentation

## 2016-02-04 DIAGNOSIS — E785 Hyperlipidemia, unspecified: Secondary | ICD-10-CM | POA: Insufficient documentation

## 2016-02-04 DIAGNOSIS — I779 Disorder of arteries and arterioles, unspecified: Secondary | ICD-10-CM | POA: Insufficient documentation

## 2016-02-04 DIAGNOSIS — I739 Peripheral vascular disease, unspecified: Secondary | ICD-10-CM | POA: Diagnosis not present

## 2016-02-04 DIAGNOSIS — R938 Abnormal findings on diagnostic imaging of other specified body structures: Secondary | ICD-10-CM | POA: Diagnosis not present

## 2016-02-11 ENCOUNTER — Other Ambulatory Visit: Payer: Self-pay | Admitting: *Deleted

## 2016-02-11 DIAGNOSIS — I739 Peripheral vascular disease, unspecified: Secondary | ICD-10-CM

## 2016-02-11 MED ORDER — CILOSTAZOL 100 MG PO TABS: 100.0000 mg | ORAL_TABLET | Freq: Every day | ORAL | Status: DC

## 2016-02-11 MED ORDER — FUROSEMIDE 20 MG PO TABS: 40.0000 mg | ORAL_TABLET | Freq: Every day | ORAL | Status: DC

## 2016-02-11 MED ORDER — CARVEDILOL 6.25 MG PO TABS: 6.2500 mg | ORAL_TABLET | Freq: Two times a day (BID) | ORAL | Status: DC

## 2016-02-11 MED ORDER — LISINOPRIL 20 MG PO TABS: 20.0000 mg | ORAL_TABLET | Freq: Every day | ORAL | Status: DC

## 2016-02-11 MED ORDER — CLOPIDOGREL BISULFATE 75 MG PO TABS: 75.0000 mg | ORAL_TABLET | Freq: Every day | ORAL | Status: DC

## 2016-02-11 MED ORDER — POTASSIUM CHLORIDE ER 10 MEQ PO TBCR: 10.0000 meq | EXTENDED_RELEASE_TABLET | Freq: Two times a day (BID) | ORAL | Status: DC

## 2016-03-11 ENCOUNTER — Other Ambulatory Visit: Payer: Self-pay | Admitting: Cardiovascular Disease

## 2016-05-14 ENCOUNTER — Other Ambulatory Visit: Payer: Self-pay | Admitting: *Deleted

## 2016-05-14 MED ORDER — CILOSTAZOL 100 MG PO TABS: 100.0000 mg | ORAL_TABLET | Freq: Every day | ORAL | 0 refills | Status: AC

## 2016-05-14 MED ORDER — POTASSIUM CHLORIDE ER 10 MEQ PO TBCR: 10.0000 meq | EXTENDED_RELEASE_TABLET | Freq: Two times a day (BID) | ORAL | 0 refills | Status: AC

## 2016-05-14 MED ORDER — FUROSEMIDE 20 MG PO TABS: 40.0000 mg | ORAL_TABLET | Freq: Every day | ORAL | 0 refills | Status: AC

## 2016-05-14 MED ORDER — CARVEDILOL 6.25 MG PO TABS: 6.2500 mg | ORAL_TABLET | Freq: Two times a day (BID) | ORAL | 0 refills | Status: AC

## 2016-05-14 MED ORDER — CLOPIDOGREL BISULFATE 75 MG PO TABS: 75.0000 mg | ORAL_TABLET | Freq: Every day | ORAL | 0 refills | Status: AC

## 2016-05-14 MED ORDER — LISINOPRIL 20 MG PO TABS: 20.0000 mg | ORAL_TABLET | Freq: Every day | ORAL | 0 refills | Status: AC

## 2016-07-28 DIAGNOSIS — E860 Dehydration: Secondary | ICD-10-CM

## 2016-07-28 DIAGNOSIS — N390 Urinary tract infection, site not specified: Secondary | ICD-10-CM

## 2016-07-28 DIAGNOSIS — K567 Ileus, unspecified: Secondary | ICD-10-CM | POA: Diagnosis not present

## 2016-07-28 DIAGNOSIS — N179 Acute kidney failure, unspecified: Secondary | ICD-10-CM

## 2016-07-28 DIAGNOSIS — R630 Anorexia: Secondary | ICD-10-CM

## 2016-07-28 DIAGNOSIS — K5900 Constipation, unspecified: Secondary | ICD-10-CM | POA: Diagnosis not present

## 2016-07-28 DIAGNOSIS — E876 Hypokalemia: Secondary | ICD-10-CM

## 2016-07-28 DIAGNOSIS — K56609 Unspecified intestinal obstruction, unspecified as to partial versus complete obstruction: Secondary | ICD-10-CM | POA: Diagnosis not present

## 2016-07-28 DIAGNOSIS — R112 Nausea with vomiting, unspecified: Secondary | ICD-10-CM | POA: Diagnosis not present

## 2016-07-28 DIAGNOSIS — K529 Noninfective gastroenteritis and colitis, unspecified: Secondary | ICD-10-CM | POA: Diagnosis not present

## 2016-07-28 DIAGNOSIS — L89151 Pressure ulcer of sacral region, stage 1: Secondary | ICD-10-CM

## 2016-07-28 DIAGNOSIS — R531 Weakness: Secondary | ICD-10-CM

## 2016-07-28 DIAGNOSIS — E871 Hypo-osmolality and hyponatremia: Secondary | ICD-10-CM

## 2016-07-28 DIAGNOSIS — A419 Sepsis, unspecified organism: Secondary | ICD-10-CM | POA: Diagnosis not present

## 2016-07-29 DIAGNOSIS — E860 Dehydration: Secondary | ICD-10-CM | POA: Diagnosis not present

## 2016-07-29 DIAGNOSIS — K529 Noninfective gastroenteritis and colitis, unspecified: Secondary | ICD-10-CM | POA: Diagnosis not present

## 2016-07-29 DIAGNOSIS — R112 Nausea with vomiting, unspecified: Secondary | ICD-10-CM | POA: Diagnosis not present

## 2016-07-29 DIAGNOSIS — K5900 Constipation, unspecified: Secondary | ICD-10-CM | POA: Diagnosis not present

## 2016-07-30 DIAGNOSIS — E860 Dehydration: Secondary | ICD-10-CM | POA: Diagnosis not present

## 2016-07-30 DIAGNOSIS — R112 Nausea with vomiting, unspecified: Secondary | ICD-10-CM | POA: Diagnosis not present

## 2016-07-30 DIAGNOSIS — K5900 Constipation, unspecified: Secondary | ICD-10-CM | POA: Diagnosis not present

## 2016-07-30 DIAGNOSIS — K529 Noninfective gastroenteritis and colitis, unspecified: Secondary | ICD-10-CM | POA: Diagnosis not present

## 2016-07-31 DIAGNOSIS — R112 Nausea with vomiting, unspecified: Secondary | ICD-10-CM | POA: Diagnosis not present

## 2016-07-31 DIAGNOSIS — E860 Dehydration: Secondary | ICD-10-CM | POA: Diagnosis not present

## 2016-07-31 DIAGNOSIS — K5900 Constipation, unspecified: Secondary | ICD-10-CM | POA: Diagnosis not present

## 2016-07-31 DIAGNOSIS — K529 Noninfective gastroenteritis and colitis, unspecified: Secondary | ICD-10-CM | POA: Diagnosis not present

## 2016-08-01 DIAGNOSIS — K5900 Constipation, unspecified: Secondary | ICD-10-CM | POA: Diagnosis not present

## 2016-08-01 DIAGNOSIS — E860 Dehydration: Secondary | ICD-10-CM | POA: Diagnosis not present

## 2016-08-01 DIAGNOSIS — K529 Noninfective gastroenteritis and colitis, unspecified: Secondary | ICD-10-CM | POA: Diagnosis not present

## 2016-08-01 DIAGNOSIS — R112 Nausea with vomiting, unspecified: Secondary | ICD-10-CM | POA: Diagnosis not present

## 2016-08-02 DIAGNOSIS — D72829 Elevated white blood cell count, unspecified: Secondary | ICD-10-CM

## 2016-08-02 DIAGNOSIS — K56609 Unspecified intestinal obstruction, unspecified as to partial versus complete obstruction: Secondary | ICD-10-CM

## 2016-08-02 DIAGNOSIS — K5900 Constipation, unspecified: Secondary | ICD-10-CM | POA: Diagnosis not present

## 2016-08-02 DIAGNOSIS — A419 Sepsis, unspecified organism: Secondary | ICD-10-CM

## 2016-08-02 DIAGNOSIS — K567 Ileus, unspecified: Secondary | ICD-10-CM

## 2016-08-02 DIAGNOSIS — J159 Unspecified bacterial pneumonia: Secondary | ICD-10-CM

## 2016-08-03 DIAGNOSIS — A419 Sepsis, unspecified organism: Secondary | ICD-10-CM | POA: Diagnosis not present

## 2016-08-03 DIAGNOSIS — K5900 Constipation, unspecified: Secondary | ICD-10-CM | POA: Diagnosis not present

## 2016-08-03 DIAGNOSIS — K56609 Unspecified intestinal obstruction, unspecified as to partial versus complete obstruction: Secondary | ICD-10-CM | POA: Diagnosis not present

## 2016-08-03 DIAGNOSIS — K567 Ileus, unspecified: Secondary | ICD-10-CM | POA: Diagnosis not present

## 2016-08-04 DIAGNOSIS — E119 Type 2 diabetes mellitus without complications: Secondary | ICD-10-CM

## 2016-08-04 DIAGNOSIS — K56609 Unspecified intestinal obstruction, unspecified as to partial versus complete obstruction: Secondary | ICD-10-CM

## 2016-08-04 DIAGNOSIS — A419 Sepsis, unspecified organism: Secondary | ICD-10-CM

## 2016-08-04 DIAGNOSIS — I10 Essential (primary) hypertension: Secondary | ICD-10-CM

## 2016-08-04 DIAGNOSIS — J159 Unspecified bacterial pneumonia: Secondary | ICD-10-CM

## 2016-08-04 DIAGNOSIS — K7200 Acute and subacute hepatic failure without coma: Secondary | ICD-10-CM | POA: Diagnosis not present

## 2016-08-04 DIAGNOSIS — N390 Urinary tract infection, site not specified: Secondary | ICD-10-CM

## 2016-08-04 DIAGNOSIS — N179 Acute kidney failure, unspecified: Secondary | ICD-10-CM | POA: Diagnosis not present

## 2016-08-04 DIAGNOSIS — I739 Peripheral vascular disease, unspecified: Secondary | ICD-10-CM

## 2016-08-05 DIAGNOSIS — K7200 Acute and subacute hepatic failure without coma: Secondary | ICD-10-CM | POA: Diagnosis not present

## 2016-08-05 DIAGNOSIS — A419 Sepsis, unspecified organism: Secondary | ICD-10-CM | POA: Diagnosis not present

## 2016-08-05 DIAGNOSIS — N179 Acute kidney failure, unspecified: Secondary | ICD-10-CM | POA: Diagnosis not present

## 2016-08-06 DIAGNOSIS — 419620001 Death: Secondary | SNOMED CT | POA: Diagnosis not present

## 2016-08-06 DEATH — deceased
